# Patient Record
Sex: Male | Born: 1955 | Race: White | Hispanic: No | Marital: Married | Smoking: Former smoker
Health system: Southern US, Community
[De-identification: ages and names within clinical notes are randomized; demographics above are authoritative.]

## PROBLEM LIST (undated history)

## (undated) DIAGNOSIS — I10 Essential (primary) hypertension: Secondary | ICD-10-CM

## (undated) DIAGNOSIS — Z973 Presence of spectacles and contact lenses: Secondary | ICD-10-CM

## (undated) DIAGNOSIS — K219 Gastro-esophageal reflux disease without esophagitis: Secondary | ICD-10-CM

## (undated) DIAGNOSIS — M109 Gout, unspecified: Secondary | ICD-10-CM

## (undated) DIAGNOSIS — R350 Frequency of micturition: Secondary | ICD-10-CM

## (undated) DIAGNOSIS — G4730 Sleep apnea, unspecified: Secondary | ICD-10-CM

## (undated) DIAGNOSIS — E7800 Pure hypercholesterolemia, unspecified: Secondary | ICD-10-CM

## (undated) DIAGNOSIS — L309 Dermatitis, unspecified: Secondary | ICD-10-CM

## (undated) DIAGNOSIS — R0609 Other forms of dyspnea: Secondary | ICD-10-CM

## (undated) DIAGNOSIS — M1990 Unspecified osteoarthritis, unspecified site: Secondary | ICD-10-CM

## (undated) HISTORY — PX: OTHER SURGICAL HISTORY: SHX169

## (undated) HISTORY — DX: Other forms of dyspnea: R06.09

## (undated) HISTORY — PX: APPENDECTOMY: SHX54

## (undated) HISTORY — PX: TONSILLECTOMY: SUR1361

---

## 2004-08-04 ENCOUNTER — Ambulatory Visit (HOSPITAL_COMMUNITY): Admission: RE | Admit: 2004-08-04 | Discharge: 2004-08-04 | Payer: Self-pay | Admitting: *Deleted

## 2004-08-04 ENCOUNTER — Encounter (INDEPENDENT_AMBULATORY_CARE_PROVIDER_SITE_OTHER): Payer: Self-pay | Admitting: Specialist

## 2006-07-23 ENCOUNTER — Emergency Department: Payer: Self-pay | Admitting: Emergency Medicine

## 2006-12-09 ENCOUNTER — Ambulatory Visit: Payer: Self-pay | Admitting: Gastroenterology

## 2006-12-30 ENCOUNTER — Ambulatory Visit: Payer: Self-pay | Admitting: Gastroenterology

## 2007-01-24 ENCOUNTER — Ambulatory Visit: Payer: Self-pay | Admitting: Gastroenterology

## 2007-02-12 ENCOUNTER — Ambulatory Visit: Payer: Self-pay | Admitting: Gastroenterology

## 2007-11-14 DIAGNOSIS — I2510 Atherosclerotic heart disease of native coronary artery without angina pectoris: Secondary | ICD-10-CM | POA: Insufficient documentation

## 2007-11-14 DIAGNOSIS — K222 Esophageal obstruction: Secondary | ICD-10-CM | POA: Insufficient documentation

## 2007-11-14 DIAGNOSIS — K219 Gastro-esophageal reflux disease without esophagitis: Secondary | ICD-10-CM | POA: Insufficient documentation

## 2010-07-30 HISTORY — PX: OTHER SURGICAL HISTORY: SHX169

## 2010-12-12 NOTE — Assessment & Plan Note (Signed)
[facility]                         GASTROENTEROLOGY OFFICE NOTE   NAME:ALLENWallice, Mitchell Todd                      MRN:          [id number]  DATE:02/12/2007                            DOB:          21-Jun-1956    PROBLEM:  Dysphagia and nausea.   Mr. Lave has returned following upper endoscopy with Savary dilatation.  Since the dilatation he has had no further dysphagia.  Nausea has  subsided after switching from Aciphex to Protonix.  All together he is  feeling well and has no current GI complaints.   EXAMINATION:  Pulse 64, blood pressure 118/72, weight 193.   IMPRESSION:  1. Gastroesophageal reflux disease - well-controlled with proton pump      inhibitor therapy.  2. Esophageal stricture - asymptomatic following dilatation therapy.   RECOMMENDATIONS:  Continue Protonix and repeat dilatation as needed.     Barbette Hair. Arlyce Dice, MD,FACG  Electronically Signed    RDK/MedQ  DD: 02/12/2007  DT: 02/12/2007  Job #: [id number]   cc:   Molly Maduro A. Nicholos Johns, M.D.

## 2010-12-12 NOTE — Assessment & Plan Note (Signed)
[facility]                         GASTROENTEROLOGY OFFICE NOTE   NAME:Mitchell Todd, Mitchell Todd                      MRN:          [id number]  DATE:12/09/2006                            DOB:          24-Apr-1956    PROBLEM:  Nausea.   Mitchell Todd is a 55 year old white male, here for evaluation of nausea.  He tends to have nausea soon after breakfast and throughout the morning.  He occasionally vomits.  He denies abdominal pain, dysphagia or pyrosis.  He has a history of grade 3 esophagitis diagnosed in 2002.  He was  taking Nexium, until three years ago when he was switched to AcipHex.  The only other medication is Flomax.   PAST MEDICAL HISTORY:  Pertinent for both parents with heart disease.  Other medications include Metamucil and aspirin daily.   ALLERGIES:  HE IS ALLERGIC TO PENICILLIN.   SOCIAL HISTORY:  He does not smoke.  He drinks rarely.  He is married.   REVIEW OF SYSTEMS:  Was reviewed and is negative.   EXAMINATION:  VITAL SIGNS:  Pulse 80, blood pressure 122/82, weight 193.  HEENT:  EOMI. PERRLA. Sclerae are anicteric.  Conjunctivae are pink.  NECK:  Supple without thyromegaly, adenopathy or carotid bruits.  CHEST:  Clear to auscultation and percussion without adventitious  sounds.  CARDIAC:  Regular rhythm; normal S1 S2.  There are no murmurs, gallops  or rubs.  ABDOMEN:  Bowel sounds are normoactive.  Abdomen is soft, non-tender and  non-distended.  There are no abdominal masses, tenderness, splenic  enlargement or hepatomegaly.  EXTREMITIES:  Full range of motion.  No cyanosis, clubbing or edema.  RECTAL:  There are no masses.  Stool is Hemoccult negative.   IMPRESSION:  Nausea.  This could be due to medications (Aciphex),  effect.  Alternatively, he could indeed have esophagitis causing nausea.  Gastroparesis is another consideration.   RECOMMENDATION:  1. Hold Aciphex for several days, if symptoms are not improved or  worsened, then I will      proceed with upper endoscopy.  2. To consider screening colonoscopy at some point in the future.     Barbette Hair. Arlyce Dice, MD,FACG  Electronically Signed    RDK/MedQ  DD: 12/09/2006  DT: 12/09/2006  Job #: [id number]   cc:   Alanson Puls, M.D.  Rosanne Gutting

## 2010-12-12 NOTE — Assessment & Plan Note (Signed)
[facility]                         GASTROENTEROLOGY OFFICE NOTE   NAME:Mitchell Todd, Mitchell Todd                      MRN:          [id number]  DATE:12/30/2006                            DOB:          [birth year]    CHIEF COMPLAINT:  Nausea.   HISTORY:  Mitchell Todd has returned for a reevaluation.  Off AcipHex nausea  did slightly subside, but he then developed severe pyrosis.  With  resumption of AcipHex, reflux improved though still there.  He has some  dysphasia to solids.  Nausea is also persistent, but an intermittent  problem.   PHYSICAL EXAMINATION:  Pulse 80, blood pressure 122/76, weight 193.   IMPRESSION:  1. Nausea.  This is probably a combination of GERD and possibly his      PPI therapy.  2. Early dysphasia -- secondary to early esophageal stricture.   RECOMMENDATIONS:  Upper endoscopy with dilatation is indicated.  I will  consider switching him from AcipHex to another PPI; if nausea, in fact,  persists.     Barbette Hair. Arlyce Dice, MD,FACG  Electronically Signed    RDK/MedQ  DD: 12/30/2006  DT: 12/31/2006  Job #: [id number]   cc:   Molly Maduro A. Nicholos Johns, M.D.

## 2015-03-01 ENCOUNTER — Encounter: Payer: Self-pay | Admitting: Gastroenterology

## 2018-04-29 DIAGNOSIS — S060XAA Concussion with loss of consciousness status unknown, initial encounter: Secondary | ICD-10-CM

## 2018-04-29 DIAGNOSIS — S060X9A Concussion with loss of consciousness of unspecified duration, initial encounter: Secondary | ICD-10-CM

## 2018-04-29 HISTORY — DX: Concussion with loss of consciousness status unknown, initial encounter: S06.0XAA

## 2018-04-29 HISTORY — DX: Concussion with loss of consciousness of unspecified duration, initial encounter: S06.0X9A

## 2018-09-18 DIAGNOSIS — L821 Other seborrheic keratosis: Secondary | ICD-10-CM | POA: Diagnosis not present

## 2018-09-18 DIAGNOSIS — C44519 Basal cell carcinoma of skin of other part of trunk: Secondary | ICD-10-CM | POA: Diagnosis not present

## 2018-09-18 DIAGNOSIS — D485 Neoplasm of uncertain behavior of skin: Secondary | ICD-10-CM | POA: Diagnosis not present

## 2018-09-18 DIAGNOSIS — Z85820 Personal history of malignant melanoma of skin: Secondary | ICD-10-CM | POA: Diagnosis not present

## 2018-09-18 DIAGNOSIS — L308 Other specified dermatitis: Secondary | ICD-10-CM | POA: Diagnosis not present

## 2018-09-18 DIAGNOSIS — Z85828 Personal history of other malignant neoplasm of skin: Secondary | ICD-10-CM | POA: Diagnosis not present

## 2018-09-18 DIAGNOSIS — D225 Melanocytic nevi of trunk: Secondary | ICD-10-CM | POA: Diagnosis not present

## 2018-12-17 DIAGNOSIS — R7303 Prediabetes: Secondary | ICD-10-CM | POA: Diagnosis not present

## 2018-12-17 DIAGNOSIS — Z6832 Body mass index (BMI) 32.0-32.9, adult: Secondary | ICD-10-CM | POA: Diagnosis not present

## 2018-12-17 DIAGNOSIS — J309 Allergic rhinitis, unspecified: Secondary | ICD-10-CM | POA: Diagnosis not present

## 2018-12-17 DIAGNOSIS — E291 Testicular hypofunction: Secondary | ICD-10-CM | POA: Diagnosis not present

## 2018-12-17 DIAGNOSIS — Z0000 Encounter for general adult medical examination without abnormal findings: Secondary | ICD-10-CM | POA: Diagnosis not present

## 2018-12-17 DIAGNOSIS — K219 Gastro-esophageal reflux disease without esophagitis: Secondary | ICD-10-CM | POA: Diagnosis not present

## 2018-12-17 DIAGNOSIS — N400 Enlarged prostate without lower urinary tract symptoms: Secondary | ICD-10-CM | POA: Diagnosis not present

## 2018-12-17 DIAGNOSIS — G250 Essential tremor: Secondary | ICD-10-CM | POA: Diagnosis not present

## 2018-12-17 DIAGNOSIS — I10 Essential (primary) hypertension: Secondary | ICD-10-CM | POA: Diagnosis not present

## 2018-12-17 DIAGNOSIS — Z85820 Personal history of malignant melanoma of skin: Secondary | ICD-10-CM | POA: Diagnosis not present

## 2019-01-20 DIAGNOSIS — R7303 Prediabetes: Secondary | ICD-10-CM | POA: Diagnosis not present

## 2019-01-20 DIAGNOSIS — E291 Testicular hypofunction: Secondary | ICD-10-CM | POA: Diagnosis not present

## 2019-07-02 ENCOUNTER — Other Ambulatory Visit: Payer: Self-pay | Admitting: Family Medicine

## 2019-07-02 DIAGNOSIS — Z136 Encounter for screening for cardiovascular disorders: Secondary | ICD-10-CM

## 2019-07-14 ENCOUNTER — Ambulatory Visit
Admission: RE | Admit: 2019-07-14 | Discharge: 2019-07-14 | Disposition: A | Discharge status: Home / Self Care | Payer: PPO | Source: Ambulatory Visit | Attending: Family Medicine | Admitting: Family Medicine

## 2019-07-14 DIAGNOSIS — Z136 Encounter for screening for cardiovascular disorders: Secondary | ICD-10-CM | POA: Diagnosis not present

## 2019-07-14 DIAGNOSIS — Z87891 Personal history of nicotine dependence: Secondary | ICD-10-CM | POA: Diagnosis not present

## 2019-08-11 DIAGNOSIS — Z23 Encounter for immunization: Secondary | ICD-10-CM | POA: Diagnosis not present

## 2019-08-11 DIAGNOSIS — N400 Enlarged prostate without lower urinary tract symptoms: Secondary | ICD-10-CM | POA: Diagnosis not present

## 2019-08-11 DIAGNOSIS — K219 Gastro-esophageal reflux disease without esophagitis: Secondary | ICD-10-CM | POA: Diagnosis not present

## 2019-08-11 DIAGNOSIS — Z125 Encounter for screening for malignant neoplasm of prostate: Secondary | ICD-10-CM | POA: Diagnosis not present

## 2019-08-11 DIAGNOSIS — Z1211 Encounter for screening for malignant neoplasm of colon: Secondary | ICD-10-CM | POA: Diagnosis not present

## 2019-08-11 DIAGNOSIS — N3281 Overactive bladder: Secondary | ICD-10-CM | POA: Diagnosis not present

## 2019-08-11 DIAGNOSIS — I10 Essential (primary) hypertension: Secondary | ICD-10-CM | POA: Diagnosis not present

## 2019-08-11 DIAGNOSIS — J309 Allergic rhinitis, unspecified: Secondary | ICD-10-CM | POA: Diagnosis not present

## 2019-08-11 DIAGNOSIS — Z1389 Encounter for screening for other disorder: Secondary | ICD-10-CM | POA: Diagnosis not present

## 2019-08-11 DIAGNOSIS — Z136 Encounter for screening for cardiovascular disorders: Secondary | ICD-10-CM | POA: Diagnosis not present

## 2019-08-11 DIAGNOSIS — R7303 Prediabetes: Secondary | ICD-10-CM | POA: Diagnosis not present

## 2019-08-11 DIAGNOSIS — Z0000 Encounter for general adult medical examination without abnormal findings: Secondary | ICD-10-CM | POA: Diagnosis not present

## 2019-08-11 DIAGNOSIS — E291 Testicular hypofunction: Secondary | ICD-10-CM | POA: Diagnosis not present

## 2019-08-11 DIAGNOSIS — R7309 Other abnormal glucose: Secondary | ICD-10-CM | POA: Diagnosis not present

## 2019-08-11 DIAGNOSIS — G250 Essential tremor: Secondary | ICD-10-CM | POA: Diagnosis not present

## 2019-09-23 DIAGNOSIS — Z85828 Personal history of other malignant neoplasm of skin: Secondary | ICD-10-CM | POA: Diagnosis not present

## 2019-09-23 DIAGNOSIS — L821 Other seborrheic keratosis: Secondary | ICD-10-CM | POA: Diagnosis not present

## 2019-09-23 DIAGNOSIS — D225 Melanocytic nevi of trunk: Secondary | ICD-10-CM | POA: Diagnosis not present

## 2019-09-23 DIAGNOSIS — D1801 Hemangioma of skin and subcutaneous tissue: Secondary | ICD-10-CM | POA: Diagnosis not present

## 2019-09-23 DIAGNOSIS — Z85820 Personal history of malignant melanoma of skin: Secondary | ICD-10-CM | POA: Diagnosis not present

## 2019-09-23 DIAGNOSIS — L814 Other melanin hyperpigmentation: Secondary | ICD-10-CM | POA: Diagnosis not present

## 2019-09-28 DIAGNOSIS — K5730 Diverticulosis of large intestine without perforation or abscess without bleeding: Secondary | ICD-10-CM | POA: Diagnosis not present

## 2019-09-28 DIAGNOSIS — Z86010 Personal history of colonic polyps: Secondary | ICD-10-CM | POA: Diagnosis not present

## 2019-11-03 DIAGNOSIS — H2513 Age-related nuclear cataract, bilateral: Secondary | ICD-10-CM | POA: Diagnosis not present

## 2019-11-25 DIAGNOSIS — E7800 Pure hypercholesterolemia, unspecified: Secondary | ICD-10-CM | POA: Diagnosis not present

## 2019-11-25 DIAGNOSIS — I10 Essential (primary) hypertension: Secondary | ICD-10-CM | POA: Diagnosis not present

## 2019-11-25 DIAGNOSIS — N400 Enlarged prostate without lower urinary tract symptoms: Secondary | ICD-10-CM | POA: Diagnosis not present

## 2019-11-30 ENCOUNTER — Encounter: Payer: Self-pay | Admitting: Orthopedic Surgery

## 2019-11-30 ENCOUNTER — Ambulatory Visit: Payer: Self-pay

## 2019-11-30 ENCOUNTER — Ambulatory Visit (INDEPENDENT_AMBULATORY_CARE_PROVIDER_SITE_OTHER): Payer: PPO

## 2019-11-30 ENCOUNTER — Other Ambulatory Visit: Payer: Self-pay

## 2019-11-30 ENCOUNTER — Ambulatory Visit: Payer: PPO | Admitting: Physician Assistant

## 2019-11-30 DIAGNOSIS — G8929 Other chronic pain: Secondary | ICD-10-CM

## 2019-11-30 DIAGNOSIS — M25562 Pain in left knee: Secondary | ICD-10-CM | POA: Diagnosis not present

## 2019-11-30 DIAGNOSIS — M25561 Pain in right knee: Secondary | ICD-10-CM

## 2019-11-30 MED ORDER — METHYLPREDNISOLONE ACETATE 40 MG/ML IJ SUSP
40.0000 mg | INTRAMUSCULAR | Status: AC | PRN
  Administered 2019-11-30: 40 mg via INTRA_ARTICULAR

## 2019-11-30 MED ORDER — LIDOCAINE HCL 1 % IJ SOLN
1.0000 mL | INTRAMUSCULAR | Status: AC | PRN
  Administered 2019-11-30: 1 mL

## 2019-11-30 MED ORDER — METHYLPREDNISOLONE ACETATE 40 MG/ML IJ SUSP
40.0000 mg | INTRAMUSCULAR | Status: AC | PRN
  Administered 2019-11-30: 10:00:00 40 mg via INTRA_ARTICULAR

## 2019-11-30 NOTE — Progress Notes (Signed)
Office Visit Note   Patient: Mitchell Todd           Date of Birth: [birth year]           MRN: [id number] Visit Date: 11/30/2019              Requested by: Maury Dus, MD [place],  [place] [address] PCP: Maury Dus, MD  Chief Complaint  Patient presents with  . Right Knee - Pain  . Left Knee - Pain      HPI: This is a pleasant gentleman with bilateral anterior knee pain left greater than right is been going on for few months.  He does not have any mechanical symptoms no swelling and does not remember changing his activity level.  He notices most when he is going up and down stairs and when he has been sitting for a while and goes to a standing position  Assessment & Plan: Visit Diagnoses:  1. Chronic pain of both knees     Plan: Findings consistent with chondromalacia patella.  We talked about the natural history of this.  Quad quadricep strengthening exercises were discussed with him and demonstrated.  He is to do these daily.  I also talked about injecting his knees he would like to go forward with this today  Follow-Up Instructions: No follow-ups on file.   Ortho Exam  Patient is alert, oriented, no adenopathy, well-dressed, normal affect, normal respiratory effort. Bilateral knees no effusion no redness skin is in good condition.  He is nontender around the joint lines.  He does have some grinding more on the right than the left with flexion and extension.  Pain is reproduced around the patellofemoral joint  Imaging: No results found. No images are attached to the encounter.  Labs: No results found for: HGBA1C, ESRSEDRATE, CRP, LABURIC, REPTSTATUS, GRAMSTAIN, CULT, LABORGA   No results found for: ALBUMIN, PREALBUMIN, LABURIC  No results found for: MG No results found for: VD25OH  No results found for: PREALBUMIN No flowsheet data found.   There is no height or weight on file to calculate BMI.  Orders:  Orders Placed This  Encounter  Procedures  . XR Knee 1-2 Views Right  . XR Knee 1-2 Views Left   No orders of the defined types were placed in this encounter.    Procedures: Large Joint Inj: bilateral knee on 11/30/2019 9:50 AM Indications: pain and diagnostic evaluation Details: 22 G 1.5 in needle, anteromedial approach  Arthrogram: No  Medications (Right): 1 mL lidocaine 1 %; 40 mg methylPREDNISolone acetate 40 MG/ML Medications (Left): 1 mL lidocaine 1 %; 40 mg methylPREDNISolone acetate 40 MG/ML Outcome: tolerated well, no immediate complications Procedure, treatment alternatives, risks and benefits explained, specific risks discussed. Consent was given by the patient.      Clinical Data: No additional findings.  ROS:  All other systems negative, except as noted in the HPI. Review of Systems  Objective: Vital Signs: There were no vitals taken for this visit.  Specialty Comments:  No specialty comments available.  PMFS History: Patient Active Problem List   Diagnosis Date Noted  . CAD 11/14/2007  . ESOPHAGEAL STRICTURE 11/14/2007  . GERD 11/14/2007   No past medical history on file.  No family history on file.   Social History   Occupational History  . Not on file  Tobacco Use  . Smoking status: Not on file  Substance and Sexual Activity  . Alcohol use: Not on  file  . Drug use: Not on file  . Sexual activity: Not on file

## 2019-12-16 DIAGNOSIS — I10 Essential (primary) hypertension: Secondary | ICD-10-CM | POA: Diagnosis not present

## 2019-12-16 DIAGNOSIS — E7800 Pure hypercholesterolemia, unspecified: Secondary | ICD-10-CM | POA: Diagnosis not present

## 2019-12-16 DIAGNOSIS — N400 Enlarged prostate without lower urinary tract symptoms: Secondary | ICD-10-CM | POA: Diagnosis not present

## 2019-12-31 ENCOUNTER — Ambulatory Visit: Payer: PPO | Admitting: Orthopedic Surgery

## 2020-01-15 DIAGNOSIS — I10 Essential (primary) hypertension: Secondary | ICD-10-CM | POA: Diagnosis not present

## 2020-01-15 DIAGNOSIS — E7800 Pure hypercholesterolemia, unspecified: Secondary | ICD-10-CM | POA: Diagnosis not present

## 2020-01-15 DIAGNOSIS — N400 Enlarged prostate without lower urinary tract symptoms: Secondary | ICD-10-CM | POA: Diagnosis not present

## 2020-02-08 DIAGNOSIS — I10 Essential (primary) hypertension: Secondary | ICD-10-CM | POA: Diagnosis not present

## 2020-02-08 DIAGNOSIS — E7800 Pure hypercholesterolemia, unspecified: Secondary | ICD-10-CM | POA: Diagnosis not present

## 2020-02-08 DIAGNOSIS — J309 Allergic rhinitis, unspecified: Secondary | ICD-10-CM | POA: Diagnosis not present

## 2020-02-08 DIAGNOSIS — G250 Essential tremor: Secondary | ICD-10-CM | POA: Diagnosis not present

## 2020-02-08 DIAGNOSIS — N3281 Overactive bladder: Secondary | ICD-10-CM | POA: Diagnosis not present

## 2020-02-08 DIAGNOSIS — K219 Gastro-esophageal reflux disease without esophagitis: Secondary | ICD-10-CM | POA: Diagnosis not present

## 2020-02-08 DIAGNOSIS — N400 Enlarged prostate without lower urinary tract symptoms: Secondary | ICD-10-CM | POA: Diagnosis not present

## 2020-02-08 DIAGNOSIS — R7309 Other abnormal glucose: Secondary | ICD-10-CM | POA: Diagnosis not present

## 2020-02-08 DIAGNOSIS — E291 Testicular hypofunction: Secondary | ICD-10-CM | POA: Diagnosis not present

## 2020-02-09 DIAGNOSIS — N400 Enlarged prostate without lower urinary tract symptoms: Secondary | ICD-10-CM | POA: Diagnosis not present

## 2020-02-09 DIAGNOSIS — E7800 Pure hypercholesterolemia, unspecified: Secondary | ICD-10-CM | POA: Diagnosis not present

## 2020-02-09 DIAGNOSIS — I10 Essential (primary) hypertension: Secondary | ICD-10-CM | POA: Diagnosis not present

## 2020-02-19 DIAGNOSIS — E291 Testicular hypofunction: Secondary | ICD-10-CM | POA: Diagnosis not present

## 2020-04-07 DIAGNOSIS — I10 Essential (primary) hypertension: Secondary | ICD-10-CM | POA: Diagnosis not present

## 2020-04-07 DIAGNOSIS — E7800 Pure hypercholesterolemia, unspecified: Secondary | ICD-10-CM | POA: Diagnosis not present

## 2020-04-07 DIAGNOSIS — N400 Enlarged prostate without lower urinary tract symptoms: Secondary | ICD-10-CM | POA: Diagnosis not present

## 2020-04-07 DIAGNOSIS — K219 Gastro-esophageal reflux disease without esophagitis: Secondary | ICD-10-CM | POA: Diagnosis not present

## 2020-05-16 DIAGNOSIS — H43812 Vitreous degeneration, left eye: Secondary | ICD-10-CM | POA: Diagnosis not present

## 2020-05-17 ENCOUNTER — Other Ambulatory Visit: Payer: Self-pay | Admitting: Ophthalmology

## 2020-05-17 DIAGNOSIS — H3582 Retinal ischemia: Secondary | ICD-10-CM

## 2020-05-18 DIAGNOSIS — E7800 Pure hypercholesterolemia, unspecified: Secondary | ICD-10-CM | POA: Diagnosis not present

## 2020-05-18 DIAGNOSIS — I10 Essential (primary) hypertension: Secondary | ICD-10-CM | POA: Diagnosis not present

## 2020-05-18 DIAGNOSIS — N400 Enlarged prostate without lower urinary tract symptoms: Secondary | ICD-10-CM | POA: Diagnosis not present

## 2020-05-18 DIAGNOSIS — K219 Gastro-esophageal reflux disease without esophagitis: Secondary | ICD-10-CM | POA: Diagnosis not present

## 2020-05-24 ENCOUNTER — Other Ambulatory Visit: Payer: Self-pay

## 2020-05-24 ENCOUNTER — Ambulatory Visit
Admission: RE | Admit: 2020-05-24 | Discharge: 2020-05-24 | Disposition: A | Discharge status: Home / Self Care | Payer: PPO | Source: Ambulatory Visit | Attending: Ophthalmology | Admitting: Ophthalmology

## 2020-05-24 DIAGNOSIS — H3582 Retinal ischemia: Secondary | ICD-10-CM | POA: Diagnosis not present

## 2020-05-26 DIAGNOSIS — I6522 Occlusion and stenosis of left carotid artery: Secondary | ICD-10-CM | POA: Diagnosis not present

## 2020-05-30 DIAGNOSIS — E291 Testicular hypofunction: Secondary | ICD-10-CM | POA: Diagnosis not present

## 2020-06-01 DIAGNOSIS — Z20822 Contact with and (suspected) exposure to covid-19: Secondary | ICD-10-CM | POA: Diagnosis not present

## 2020-06-02 DIAGNOSIS — J0110 Acute frontal sinusitis, unspecified: Secondary | ICD-10-CM | POA: Diagnosis not present

## 2020-06-16 DIAGNOSIS — E7800 Pure hypercholesterolemia, unspecified: Secondary | ICD-10-CM | POA: Diagnosis not present

## 2020-06-16 DIAGNOSIS — K219 Gastro-esophageal reflux disease without esophagitis: Secondary | ICD-10-CM | POA: Diagnosis not present

## 2020-06-16 DIAGNOSIS — I10 Essential (primary) hypertension: Secondary | ICD-10-CM | POA: Diagnosis not present

## 2020-06-16 DIAGNOSIS — N400 Enlarged prostate without lower urinary tract symptoms: Secondary | ICD-10-CM | POA: Diagnosis not present

## 2020-07-25 DIAGNOSIS — E7800 Pure hypercholesterolemia, unspecified: Secondary | ICD-10-CM | POA: Diagnosis not present

## 2020-07-25 DIAGNOSIS — K219 Gastro-esophageal reflux disease without esophagitis: Secondary | ICD-10-CM | POA: Diagnosis not present

## 2020-07-25 DIAGNOSIS — I10 Essential (primary) hypertension: Secondary | ICD-10-CM | POA: Diagnosis not present

## 2020-07-25 DIAGNOSIS — N400 Enlarged prostate without lower urinary tract symptoms: Secondary | ICD-10-CM | POA: Diagnosis not present

## 2020-08-16 DIAGNOSIS — E291 Testicular hypofunction: Secondary | ICD-10-CM | POA: Diagnosis not present

## 2020-08-16 DIAGNOSIS — K219 Gastro-esophageal reflux disease without esophagitis: Secondary | ICD-10-CM | POA: Diagnosis not present

## 2020-08-16 DIAGNOSIS — Z0000 Encounter for general adult medical examination without abnormal findings: Secondary | ICD-10-CM | POA: Diagnosis not present

## 2020-08-16 DIAGNOSIS — M79671 Pain in right foot: Secondary | ICD-10-CM | POA: Diagnosis not present

## 2020-08-16 DIAGNOSIS — R0602 Shortness of breath: Secondary | ICD-10-CM | POA: Diagnosis not present

## 2020-08-16 DIAGNOSIS — Z125 Encounter for screening for malignant neoplasm of prostate: Secondary | ICD-10-CM | POA: Diagnosis not present

## 2020-08-16 DIAGNOSIS — E7800 Pure hypercholesterolemia, unspecified: Secondary | ICD-10-CM | POA: Diagnosis not present

## 2020-08-16 DIAGNOSIS — N3281 Overactive bladder: Secondary | ICD-10-CM | POA: Diagnosis not present

## 2020-08-16 DIAGNOSIS — R062 Wheezing: Secondary | ICD-10-CM | POA: Diagnosis not present

## 2020-08-16 DIAGNOSIS — G250 Essential tremor: Secondary | ICD-10-CM | POA: Diagnosis not present

## 2020-08-16 DIAGNOSIS — N400 Enlarged prostate without lower urinary tract symptoms: Secondary | ICD-10-CM | POA: Diagnosis not present

## 2020-08-16 DIAGNOSIS — R7309 Other abnormal glucose: Secondary | ICD-10-CM | POA: Diagnosis not present

## 2020-08-16 DIAGNOSIS — I10 Essential (primary) hypertension: Secondary | ICD-10-CM | POA: Diagnosis not present

## 2020-08-18 ENCOUNTER — Other Ambulatory Visit: Payer: Self-pay

## 2020-08-18 ENCOUNTER — Other Ambulatory Visit: Payer: Self-pay | Admitting: Family Medicine

## 2020-08-18 ENCOUNTER — Ambulatory Visit
Admission: RE | Admit: 2020-08-18 | Discharge: 2020-08-18 | Disposition: A | Discharge status: Home / Self Care | Payer: PPO | Source: Ambulatory Visit | Attending: Family Medicine | Admitting: Family Medicine

## 2020-08-18 DIAGNOSIS — M7989 Other specified soft tissue disorders: Secondary | ICD-10-CM | POA: Diagnosis not present

## 2020-08-18 DIAGNOSIS — M79671 Pain in right foot: Secondary | ICD-10-CM

## 2020-08-18 DIAGNOSIS — M25571 Pain in right ankle and joints of right foot: Secondary | ICD-10-CM | POA: Diagnosis not present

## 2020-09-13 DIAGNOSIS — R350 Frequency of micturition: Secondary | ICD-10-CM | POA: Diagnosis not present

## 2020-09-13 DIAGNOSIS — R3914 Feeling of incomplete bladder emptying: Secondary | ICD-10-CM | POA: Diagnosis not present

## 2020-09-13 DIAGNOSIS — R3912 Poor urinary stream: Secondary | ICD-10-CM | POA: Diagnosis not present

## 2020-09-13 DIAGNOSIS — N401 Enlarged prostate with lower urinary tract symptoms: Secondary | ICD-10-CM | POA: Diagnosis not present

## 2020-09-22 DIAGNOSIS — D225 Melanocytic nevi of trunk: Secondary | ICD-10-CM | POA: Diagnosis not present

## 2020-09-22 DIAGNOSIS — Z85828 Personal history of other malignant neoplasm of skin: Secondary | ICD-10-CM | POA: Diagnosis not present

## 2020-09-22 DIAGNOSIS — L814 Other melanin hyperpigmentation: Secondary | ICD-10-CM | POA: Diagnosis not present

## 2020-09-22 DIAGNOSIS — L821 Other seborrheic keratosis: Secondary | ICD-10-CM | POA: Diagnosis not present

## 2020-09-22 DIAGNOSIS — D485 Neoplasm of uncertain behavior of skin: Secondary | ICD-10-CM | POA: Diagnosis not present

## 2020-09-22 DIAGNOSIS — Z85820 Personal history of malignant melanoma of skin: Secondary | ICD-10-CM | POA: Diagnosis not present

## 2020-09-22 DIAGNOSIS — L820 Inflamed seborrheic keratosis: Secondary | ICD-10-CM | POA: Diagnosis not present

## 2020-09-28 DIAGNOSIS — N1831 Chronic kidney disease, stage 3a: Secondary | ICD-10-CM | POA: Diagnosis not present

## 2020-09-28 DIAGNOSIS — M109 Gout, unspecified: Secondary | ICD-10-CM | POA: Diagnosis not present

## 2020-09-29 DIAGNOSIS — M1030 Gout due to renal impairment, unspecified site: Secondary | ICD-10-CM | POA: Diagnosis not present

## 2020-09-29 DIAGNOSIS — N400 Enlarged prostate without lower urinary tract symptoms: Secondary | ICD-10-CM | POA: Diagnosis not present

## 2020-09-29 DIAGNOSIS — N1831 Chronic kidney disease, stage 3a: Secondary | ICD-10-CM | POA: Diagnosis not present

## 2020-09-29 DIAGNOSIS — K219 Gastro-esophageal reflux disease without esophagitis: Secondary | ICD-10-CM | POA: Diagnosis not present

## 2020-09-29 DIAGNOSIS — I10 Essential (primary) hypertension: Secondary | ICD-10-CM | POA: Diagnosis not present

## 2020-09-29 DIAGNOSIS — E7800 Pure hypercholesterolemia, unspecified: Secondary | ICD-10-CM | POA: Diagnosis not present

## 2020-10-06 DIAGNOSIS — N401 Enlarged prostate with lower urinary tract symptoms: Secondary | ICD-10-CM | POA: Diagnosis not present

## 2020-10-06 DIAGNOSIS — R350 Frequency of micturition: Secondary | ICD-10-CM | POA: Diagnosis not present

## 2020-10-06 DIAGNOSIS — R3914 Feeling of incomplete bladder emptying: Secondary | ICD-10-CM | POA: Diagnosis not present

## 2020-10-06 DIAGNOSIS — Z125 Encounter for screening for malignant neoplasm of prostate: Secondary | ICD-10-CM | POA: Diagnosis not present

## 2020-10-06 DIAGNOSIS — R3912 Poor urinary stream: Secondary | ICD-10-CM | POA: Diagnosis not present

## 2020-10-10 ENCOUNTER — Other Ambulatory Visit: Payer: Self-pay | Admitting: Urology

## 2020-11-09 DIAGNOSIS — N1831 Chronic kidney disease, stage 3a: Secondary | ICD-10-CM | POA: Diagnosis not present

## 2020-11-09 DIAGNOSIS — M1030 Gout due to renal impairment, unspecified site: Secondary | ICD-10-CM | POA: Diagnosis not present

## 2020-11-16 ENCOUNTER — Encounter (HOSPITAL_BASED_OUTPATIENT_CLINIC_OR_DEPARTMENT_OTHER): Payer: Self-pay | Admitting: Urology

## 2020-11-16 ENCOUNTER — Other Ambulatory Visit: Payer: Self-pay

## 2020-11-16 NOTE — Progress Notes (Signed)
Spoke w/ via phone for pre-op interview---pt Lab needs dos---- I stat, ekg              Lab results------us carotid bilateral 05-24-2020 epic COVID test -----11-18-2020 1015- Arrive at ------- 900 am 11-21-2020 NPO after MN NO Solid Food.  Clear liquids from MN until---800 am then npo Med rec completed Medications to take morning of surgery -----flomax, allopurinol, mybetriq, protonix, norvasc, zyrtec, proscar Diabetic medication -----n/a Patient instructed to bring photo id and insurance card day of surgery Patient aware to have Driver (ride ) / caregiver wife pam will stay for surgery (see note below for driver after surgery)   for 24 hours after surgery  Patient Special Instructions -----bring cpap mask tubing and machine day of surgery Pre-Op special Istructions -----pt given overnight stay instructions Patient verbalized understanding of instructions that were given at this phone interview. Patient denies shortness of breath, chest pain, fever, cough at this phone interview.  Pt plans to driver self home after surgery after spending the night, pt made aware if he is on any narcotic pain medications he will need driver day of surgery, pt vocalizaed understanding.

## 2020-11-17 ENCOUNTER — Other Ambulatory Visit (HOSPITAL_COMMUNITY)
Admission: RE | Admit: 2020-11-17 | Discharge: 2020-11-17 | Disposition: A | Discharge status: Home / Self Care | Payer: PPO | Source: Ambulatory Visit | Attending: Urology | Admitting: Urology

## 2020-11-17 DIAGNOSIS — Z20822 Contact with and (suspected) exposure to covid-19: Secondary | ICD-10-CM | POA: Diagnosis not present

## 2020-11-17 DIAGNOSIS — Z01812 Encounter for preprocedural laboratory examination: Secondary | ICD-10-CM | POA: Insufficient documentation

## 2020-11-17 LAB — SARS CORONAVIRUS 2 (TAT 6-24 HRS): SARS Coronavirus 2: NEGATIVE

## 2020-11-18 ENCOUNTER — Other Ambulatory Visit (HOSPITAL_COMMUNITY): Payer: PPO

## 2020-11-21 ENCOUNTER — Encounter (HOSPITAL_BASED_OUTPATIENT_CLINIC_OR_DEPARTMENT_OTHER): Payer: Self-pay | Admitting: Urology

## 2020-11-21 ENCOUNTER — Ambulatory Visit (HOSPITAL_BASED_OUTPATIENT_CLINIC_OR_DEPARTMENT_OTHER): Payer: PPO | Admitting: Anesthesiology

## 2020-11-21 ENCOUNTER — Encounter (HOSPITAL_BASED_OUTPATIENT_CLINIC_OR_DEPARTMENT_OTHER)
Admission: RE | Disposition: A | Discharge status: Home / Self Care | Payer: Self-pay | Source: Home / Self Care | Attending: Urology

## 2020-11-21 ENCOUNTER — Other Ambulatory Visit: Payer: Self-pay

## 2020-11-21 ENCOUNTER — Ambulatory Visit (HOSPITAL_BASED_OUTPATIENT_CLINIC_OR_DEPARTMENT_OTHER)
Admission: RE | Admit: 2020-11-21 | Discharge: 2020-11-22 | Disposition: A | Discharge status: Home / Self Care | Payer: PPO | Attending: Urology | Admitting: Urology

## 2020-11-21 DIAGNOSIS — R3912 Poor urinary stream: Secondary | ICD-10-CM | POA: Insufficient documentation

## 2020-11-21 DIAGNOSIS — Z880 Allergy status to penicillin: Secondary | ICD-10-CM | POA: Diagnosis not present

## 2020-11-21 DIAGNOSIS — I10 Essential (primary) hypertension: Secondary | ICD-10-CM | POA: Diagnosis not present

## 2020-11-21 DIAGNOSIS — E785 Hyperlipidemia, unspecified: Secondary | ICD-10-CM | POA: Diagnosis not present

## 2020-11-21 DIAGNOSIS — N189 Chronic kidney disease, unspecified: Secondary | ICD-10-CM | POA: Diagnosis not present

## 2020-11-21 DIAGNOSIS — Z7982 Long term (current) use of aspirin: Secondary | ICD-10-CM | POA: Insufficient documentation

## 2020-11-21 DIAGNOSIS — Z79899 Other long term (current) drug therapy: Secondary | ICD-10-CM | POA: Diagnosis not present

## 2020-11-21 DIAGNOSIS — R351 Nocturia: Secondary | ICD-10-CM | POA: Diagnosis not present

## 2020-11-21 DIAGNOSIS — K219 Gastro-esophageal reflux disease without esophagitis: Secondary | ICD-10-CM | POA: Insufficient documentation

## 2020-11-21 DIAGNOSIS — Z87891 Personal history of nicotine dependence: Secondary | ICD-10-CM | POA: Diagnosis not present

## 2020-11-21 DIAGNOSIS — N400 Enlarged prostate without lower urinary tract symptoms: Secondary | ICD-10-CM | POA: Diagnosis not present

## 2020-11-21 DIAGNOSIS — N3281 Overactive bladder: Secondary | ICD-10-CM | POA: Diagnosis not present

## 2020-11-21 DIAGNOSIS — R3914 Feeling of incomplete bladder emptying: Secondary | ICD-10-CM | POA: Diagnosis not present

## 2020-11-21 DIAGNOSIS — I129 Hypertensive chronic kidney disease with stage 1 through stage 4 chronic kidney disease, or unspecified chronic kidney disease: Secondary | ICD-10-CM | POA: Insufficient documentation

## 2020-11-21 DIAGNOSIS — R350 Frequency of micturition: Secondary | ICD-10-CM | POA: Insufficient documentation

## 2020-11-21 DIAGNOSIS — N401 Enlarged prostate with lower urinary tract symptoms: Secondary | ICD-10-CM | POA: Insufficient documentation

## 2020-11-21 DIAGNOSIS — Z85828 Personal history of other malignant neoplasm of skin: Secondary | ICD-10-CM | POA: Diagnosis not present

## 2020-11-21 DIAGNOSIS — N138 Other obstructive and reflux uropathy: Secondary | ICD-10-CM | POA: Insufficient documentation

## 2020-11-21 DIAGNOSIS — I2510 Atherosclerotic heart disease of native coronary artery without angina pectoris: Secondary | ICD-10-CM | POA: Diagnosis not present

## 2020-11-21 DIAGNOSIS — N320 Bladder-neck obstruction: Secondary | ICD-10-CM | POA: Diagnosis not present

## 2020-11-21 HISTORY — DX: Unspecified osteoarthritis, unspecified site: M19.90

## 2020-11-21 HISTORY — DX: Presence of spectacles and contact lenses: Z97.3

## 2020-11-21 HISTORY — DX: Dermatitis, unspecified: L30.9

## 2020-11-21 HISTORY — DX: Frequency of micturition: R35.0

## 2020-11-21 HISTORY — DX: Sleep apnea, unspecified: G47.30

## 2020-11-21 HISTORY — DX: Pure hypercholesterolemia, unspecified: E78.00

## 2020-11-21 HISTORY — PX: TRANSURETHRAL RESECTION OF PROSTATE: SHX73

## 2020-11-21 HISTORY — DX: Essential (primary) hypertension: I10

## 2020-11-21 HISTORY — DX: Gastro-esophageal reflux disease without esophagitis: K21.9

## 2020-11-21 HISTORY — DX: Gout, unspecified: M10.9

## 2020-11-21 LAB — POCT I-STAT, CHEM 8
BUN: 19 mg/dL (ref 8–23)
Calcium, Ion: 1.2 mmol/L (ref 1.15–1.40)
Chloride: 100 mmol/L (ref 98–111)
Creatinine, Ser: 1.1 mg/dL (ref 0.61–1.24)
Glucose, Bld: 109 mg/dL — ABNORMAL HIGH (ref 70–99)
HCT: 44 % (ref 39.0–52.0)
Hemoglobin: 15 g/dL (ref 13.0–17.0)
Potassium: 4.1 mmol/L (ref 3.5–5.1)
Sodium: 136 mmol/L (ref 135–145)
TCO2: 25 mmol/L (ref 22–32)

## 2020-11-21 SURGERY — TURP (TRANSURETHRAL RESECTION OF PROSTATE)
Anesthesia: General | Site: Prostate

## 2020-11-21 MED ORDER — PROPOFOL 10 MG/ML IV BOLUS
INTRAVENOUS | Status: AC
  Filled 2020-11-21: qty 20

## 2020-11-21 MED ORDER — HYDROCHLOROTHIAZIDE 12.5 MG PO CAPS
12.5000 mg | ORAL_CAPSULE | Freq: Every day | ORAL | Status: DC
  Administered 2020-11-21: 12.5 mg via ORAL
  Filled 2020-11-21: qty 1

## 2020-11-21 MED ORDER — IRBESARTAN 300 MG PO TABS
300.0000 mg | ORAL_TABLET | Freq: Every day | ORAL | Status: DC
  Administered 2020-11-21: 300 mg via ORAL
  Filled 2020-11-21: qty 1

## 2020-11-21 MED ORDER — TAMSULOSIN HCL 0.4 MG PO CAPS: 0.4000 mg | ORAL_CAPSULE | Freq: Every day | ORAL | Status: DC

## 2020-11-21 MED ORDER — DOCUSATE SODIUM 100 MG PO CAPS: 100.0000 mg | ORAL_CAPSULE | Freq: Every day | ORAL | 0 refills | Status: DC | PRN

## 2020-11-21 MED ORDER — DEXAMETHASONE SODIUM PHOSPHATE 4 MG/ML IJ SOLN
INTRAMUSCULAR | Status: DC | PRN
  Administered 2020-11-21: 8 mg via INTRAVENOUS

## 2020-11-21 MED ORDER — CEFAZOLIN SODIUM-DEXTROSE 2-3 GM-%(50ML) IV SOLR
INTRAVENOUS | Status: DC | PRN
  Administered 2020-11-21: 2 g via INTRAVENOUS

## 2020-11-21 MED ORDER — BELLADONNA ALKALOIDS-OPIUM 16.2-60 MG RE SUPP
RECTAL | Status: DC | PRN
  Administered 2020-11-21: 1 via RECTAL

## 2020-11-21 MED ORDER — CEFAZOLIN SODIUM-DEXTROSE 2-4 GM/100ML-% IV SOLN: 2.0000 g | Freq: Once | INTRAVENOUS | Status: DC

## 2020-11-21 MED ORDER — LACTATED RINGERS IV SOLN: INTRAVENOUS | Status: DC

## 2020-11-21 MED ORDER — OXYCODONE HCL 5 MG/5ML PO SOLN: 5.0000 mg | Freq: Once | ORAL | Status: DC | PRN

## 2020-11-21 MED ORDER — OXYCODONE-ACETAMINOPHEN 5-325 MG PO TABS
1.0000 | ORAL_TABLET | ORAL | Status: DC | PRN
Start: 2020-11-21 — End: 2020-11-22

## 2020-11-21 MED ORDER — CEFAZOLIN SODIUM-DEXTROSE 2-4 GM/100ML-% IV SOLN
INTRAVENOUS | Status: AC
  Filled 2020-11-21: qty 100

## 2020-11-21 MED ORDER — FAMOTIDINE 20 MG PO TABS
ORAL_TABLET | ORAL | Status: AC
  Filled 2020-11-21: qty 2

## 2020-11-21 MED ORDER — AMLODIPINE BESYLATE 5 MG PO TABS
5.0000 mg | ORAL_TABLET | Freq: Every day | ORAL | Status: DC
  Filled 2020-11-21: qty 1

## 2020-11-21 MED ORDER — LORATADINE 10 MG PO TABS: 10.0000 mg | ORAL_TABLET | Freq: Every day | ORAL | Status: DC

## 2020-11-21 MED ORDER — BELLADONNA ALKALOIDS-OPIUM 16.2-60 MG RE SUPP
RECTAL | Status: AC
  Filled 2020-11-21: qty 1

## 2020-11-21 MED ORDER — MIDAZOLAM HCL 2 MG/2ML IJ SOLN
INTRAMUSCULAR | Status: AC
  Filled 2020-11-21: qty 2

## 2020-11-21 MED ORDER — HYDROMORPHONE HCL 1 MG/ML IJ SOLN: 0.5000 mg | INTRAMUSCULAR | Status: DC | PRN

## 2020-11-21 MED ORDER — ACETAMINOPHEN 500 MG PO TABS
ORAL_TABLET | ORAL | Status: AC
  Filled 2020-11-21: qty 2

## 2020-11-21 MED ORDER — DOCUSATE SODIUM 100 MG PO CAPS
ORAL_CAPSULE | ORAL | Status: AC
  Filled 2020-11-21: qty 1

## 2020-11-21 MED ORDER — SODIUM CHLORIDE 0.9 % IR SOLN
3000.0000 mL | Status: DC
  Administered 2020-11-22: 3000 mL

## 2020-11-21 MED ORDER — ONDANSETRON HCL 4 MG/2ML IJ SOLN
4.0000 mg | Freq: Once | INTRAMUSCULAR | Status: AC | PRN
  Administered 2020-11-21: 4 mg via INTRAVENOUS

## 2020-11-21 MED ORDER — ONDANSETRON HCL 4 MG/2ML IJ SOLN
4.0000 mg | INTRAMUSCULAR | Status: DC | PRN
  Administered 2020-11-21: 4 mg via INTRAVENOUS

## 2020-11-21 MED ORDER — FENTANYL CITRATE (PF) 100 MCG/2ML IJ SOLN
INTRAMUSCULAR | Status: DC | PRN
  Administered 2020-11-21: 100 ug via INTRAVENOUS

## 2020-11-21 MED ORDER — FAMOTIDINE 20 MG PO TABS
40.0000 mg | ORAL_TABLET | Freq: Every day | ORAL | Status: DC
  Administered 2020-11-21: 40 mg via ORAL

## 2020-11-21 MED ORDER — DIPHENHYDRAMINE HCL 12.5 MG/5ML PO ELIX: 12.5000 mg | ORAL_SOLUTION | Freq: Four times a day (QID) | ORAL | Status: DC | PRN

## 2020-11-21 MED ORDER — LIDOCAINE HCL (CARDIAC) PF 100 MG/5ML IV SOSY
PREFILLED_SYRINGE | INTRAVENOUS | Status: DC | PRN
  Administered 2020-11-21: 60 mg via INTRAVENOUS

## 2020-11-21 MED ORDER — BACITRACIN-NEOMYCIN-POLYMYXIN 400-5-5000 EX OINT: 1.0000 "application " | TOPICAL_OINTMENT | Freq: Three times a day (TID) | CUTANEOUS | Status: DC | PRN

## 2020-11-21 MED ORDER — SODIUM CHLORIDE 0.9% FLUSH: 3.0000 mL | Freq: Two times a day (BID) | INTRAVENOUS | Status: DC

## 2020-11-21 MED ORDER — HYDROMORPHONE HCL 1 MG/ML IJ SOLN
INTRAMUSCULAR | Status: AC
  Filled 2020-11-21: qty 1

## 2020-11-21 MED ORDER — SODIUM CHLORIDE 0.9 % IR SOLN
Status: DC | PRN
  Administered 2020-11-21 (×3): 6000 mL
  Administered 2020-11-21: 3000 mL

## 2020-11-21 MED ORDER — OXYBUTYNIN CHLORIDE ER 10 MG PO TB24
ORAL_TABLET | ORAL | Status: AC
  Filled 2020-11-21: qty 1

## 2020-11-21 MED ORDER — PROPOFOL 10 MG/ML IV BOLUS
INTRAVENOUS | Status: DC | PRN
  Administered 2020-11-21: 200 mg via INTRAVENOUS
  Administered 2020-11-21: 50 mg via INTRAVENOUS

## 2020-11-21 MED ORDER — OXYCODONE HCL 5 MG PO TABS: 5.0000 mg | ORAL_TABLET | Freq: Once | ORAL | Status: DC | PRN

## 2020-11-21 MED ORDER — ONDANSETRON HCL 4 MG/2ML IJ SOLN
INTRAMUSCULAR | Status: AC
  Filled 2020-11-21: qty 2

## 2020-11-21 MED ORDER — ALLOPURINOL 300 MG PO TABS
300.0000 mg | ORAL_TABLET | Freq: Every day | ORAL | Status: DC
  Filled 2020-11-21: qty 1

## 2020-11-21 MED ORDER — DIPHENHYDRAMINE HCL 50 MG/ML IJ SOLN: 12.5000 mg | Freq: Four times a day (QID) | INTRAMUSCULAR | Status: DC | PRN

## 2020-11-21 MED ORDER — DOCUSATE SODIUM 100 MG PO CAPS
100.0000 mg | ORAL_CAPSULE | Freq: Two times a day (BID) | ORAL | Status: DC
  Administered 2020-11-21: 100 mg via ORAL

## 2020-11-21 MED ORDER — PHENYLEPHRINE HCL (PRESSORS) 10 MG/ML IV SOLN
INTRAVENOUS | Status: DC | PRN
  Administered 2020-11-21 (×3): 80 ug via INTRAVENOUS

## 2020-11-21 MED ORDER — ACETAMINOPHEN 325 MG PO TABS: 650.0000 mg | ORAL_TABLET | ORAL | Status: DC | PRN

## 2020-11-21 MED ORDER — MIDAZOLAM HCL 2 MG/2ML IJ SOLN
INTRAMUSCULAR | Status: DC | PRN
  Administered 2020-11-21: 1 mg via INTRAVENOUS

## 2020-11-21 MED ORDER — POTASSIUM CHLORIDE IN NACL 20-0.45 MEQ/L-% IV SOLN
INTRAVENOUS | Status: DC
  Filled 2020-11-21 (×4): qty 1000

## 2020-11-21 MED ORDER — HEPARIN SODIUM (PORCINE) 5000 UNIT/ML IJ SOLN
INTRAMUSCULAR | Status: AC
  Filled 2020-11-21: qty 1

## 2020-11-21 MED ORDER — BELLADONNA ALKALOIDS-OPIUM 16.2-60 MG RE SUPP: 1.0000 | Freq: Four times a day (QID) | RECTAL | Status: DC | PRN

## 2020-11-21 MED ORDER — EPHEDRINE SULFATE 50 MG/ML IJ SOLN
INTRAMUSCULAR | Status: DC | PRN
  Administered 2020-11-21: 5 mg via INTRAVENOUS

## 2020-11-21 MED ORDER — OXYCODONE-ACETAMINOPHEN 5-325 MG PO TABS: 1.0000 | ORAL_TABLET | ORAL | 0 refills | Status: DC | PRN

## 2020-11-21 MED ORDER — HEPARIN SODIUM (PORCINE) 5000 UNIT/ML IJ SOLN: 5000.0000 [IU] | Freq: Three times a day (TID) | INTRAMUSCULAR | Status: DC

## 2020-11-21 MED ORDER — SODIUM CHLORIDE 0.9% FLUSH: 3.0000 mL | INTRAVENOUS | Status: DC | PRN

## 2020-11-21 MED ORDER — ACETAMINOPHEN 500 MG PO TABS
1000.0000 mg | ORAL_TABLET | Freq: Once | ORAL | Status: AC
  Administered 2020-11-21: 1000 mg via ORAL

## 2020-11-21 MED ORDER — FENTANYL CITRATE (PF) 100 MCG/2ML IJ SOLN
INTRAMUSCULAR | Status: AC
  Filled 2020-11-21: qty 2

## 2020-11-21 MED ORDER — OXYBUTYNIN CHLORIDE ER 10 MG PO TB24
10.0000 mg | ORAL_TABLET | Freq: Every day | ORAL | Status: DC
  Administered 2020-11-21: 10 mg via ORAL

## 2020-11-21 MED ORDER — SODIUM CHLORIDE 0.9 % IV SOLN: 250.0000 mL | INTRAVENOUS | Status: DC | PRN

## 2020-11-21 MED ORDER — ZOLPIDEM TARTRATE 5 MG PO TABS: 5.0000 mg | ORAL_TABLET | Freq: Every evening | ORAL | Status: DC | PRN

## 2020-11-21 MED ORDER — HYDROMORPHONE HCL 1 MG/ML IJ SOLN
0.2500 mg | INTRAMUSCULAR | Status: DC | PRN
  Administered 2020-11-21: 0.5 mg via INTRAVENOUS

## 2020-11-21 MED ORDER — BACITRACIN-NEOMYCIN-POLYMYXIN OINTMENT TUBE
TOPICAL_OINTMENT | CUTANEOUS | Status: AC
  Filled 2020-11-21: qty 14.17

## 2020-11-21 SURGICAL SUPPLY — 27 items
BAG DRAIN URO-CYSTO SKYTR STRL (DRAIN) ×2 IMPLANT
BAG DRN RND TRDRP ANRFLXCHMBR (UROLOGICAL SUPPLIES) ×1
BAG DRN UROCATH (DRAIN) ×1
BAG URINE DRAIN 2000ML AR STRL (UROLOGICAL SUPPLIES) ×2 IMPLANT
BAG URINE LEG 500ML (DRAIN) IMPLANT
CATH FOLEY 2WAY SLVR 30CC 20FR (CATHETERS) IMPLANT
CATH FOLEY 3WAY 30CC 22FR (CATHETERS) ×2 IMPLANT
CLOTH BEACON ORANGE TIMEOUT ST (SAFETY) ×2 IMPLANT
ELECT BIVAP BIPO 22/24 DONUT (ELECTROSURGICAL) ×2
ELECT REM PT RETURN 9FT ADLT (ELECTROSURGICAL)
ELECTRD BIVAP BIPO 22/24 DONUT (ELECTROSURGICAL) ×1 IMPLANT
ELECTRODE REM PT RTRN 9FT ADLT (ELECTROSURGICAL) IMPLANT
GLOVE SURG ENC MOIS LTX SZ7 (GLOVE) ×2 IMPLANT
GOWN STRL REUS W/TWL LRG LVL3 (GOWN DISPOSABLE) ×2 IMPLANT
HOLDER FOLEY CATH W/STRAP (MISCELLANEOUS) ×2 IMPLANT
IV NS IRRIG 3000ML ARTHROMATIC (IV SOLUTION) ×20 IMPLANT
KIT TURNOVER CYSTO (KITS) ×2 IMPLANT
LOOP CUT BIPOLAR 24F LRG (ELECTROSURGICAL) ×2 IMPLANT
MANIFOLD NEPTUNE II (INSTRUMENTS) ×2 IMPLANT
NS IRRIG 500ML POUR BTL (IV SOLUTION) ×2 IMPLANT
PACK CYSTO (CUSTOM PROCEDURE TRAY) ×2 IMPLANT
SYR 30ML LL (SYRINGE) ×2 IMPLANT
SYR TOOMEY IRRIG 70ML (MISCELLANEOUS) ×2
SYRINGE TOOMEY IRRIG 70ML (MISCELLANEOUS) ×1 IMPLANT
TUBE CONNECTING 12X1/4 (SUCTIONS) ×2 IMPLANT
TUBING UROLOGY SET (TUBING) ×2 IMPLANT
WATER STERILE IRR 500ML POUR (IV SOLUTION) ×2 IMPLANT

## 2020-11-21 NOTE — Op Note (Signed)
Operative Note  Preoperative diagnosis:  1.  BPH with bladder outlet obstruction  Postoperative diagnosis: 1.  BPH with bladder outlet obstruction  Procedure(s): 1.  Bipolar transurethral resection of prostate  Surgeon: Rexene Alberts, MD  Assistants:  None  Anesthesia:  General  Complications:  None  EBL:  32ml  Specimens: 1. Prostate chips ID Type Source Tests Collected by Time Destination  1 : Prostate chips Tissue PATH GU resection / TURBT / partial nephrectomy SURGICAL PATHOLOGY Janith Lima, MD 11/21/2020 1135    Drains/Catheters: 1.  22Fr 3 way catheter with 73ml water into balloon  Intraoperative findings:   1. Trilobar obstructing prostate with large intravesical component. Excellent resection with wide open prostatic fossa and excellent hemostasis at the end of the case.  Indication:  Mitchell Todd is a 65 y.o. male with BPH with bladder outlet obstruction presenting for transurethral resection of the prostate. He had a TRUS volume of 62cm^3. Uroflow demonstrated peak flow of 7.9cc/sec and avg flow of 3.4cc/sec. Preop cystoscopy demonstrated trilobar obstructing prostate. He has failed alpha blocker and 5 alpha reductase inhibitors.  After thorough discussion including all relevant risk benefits and alternatives, he presents today for a bipolar TURP.  Description of procedure: The indication, alternatives, benefits and risks were discussed with the patient and informed consent was obtained.  Patient was brought to the operating room table, positioned supine, secured with a safety strap.  Pneumatic compression devices were placed on the lower extremities.  After the administration of intravenous antibiotics and general anesthesia, the patient was repositioned into the dorsal lithotomy position.  All pressure points were carefully padded.  A rectal examination was performed confirming a smooth symmetric enlarged gland.  The genitalia were prepped and draped in standard  sterile manner.  A timeout was completed, verifying the correct patient, surgical procedure and positioning prior to beginning the procedure.  Isotonic sodium chloride was used for irrigation.  A 26 French continuous-flow resectoscope sheath with the visual obturator and a 30 degree lens was advanced under direct vision into the bladder.  The anterior urethra appeared normal in its entirety.The prostatic urethra was elongated with trilobar hyperplasia.  On cystoscopic evaluation, his bladder capacity appeared normal, the bladder wall was noted to expand symmetrically in all dimensions.  There were no tumors, stones or foreign bodies present. The bladder was trabeculated with normal-appearing mucosa.  Both ureteral orifices were in their normal anatomic positions with clear urinary reflux noted bilaterally.  The obturator was removed and replaced by the working element with a resection loop.  The location of the ureteral orifices and the prostatic configuration were again confirmed.  Starting at the bladder neck and proceeding distally to the verumontanum a transurethral section of the prostate was performed using bipolar using energy of 4 and 5 for cutting and coagulation, respectively.  The procedure began at the bladder neck at the 5 o'clock and 7 o'clock positions and carefully carried distally to the verumontanum, resecting the intervening prostatic adenoma.  Next the left lateral lobe was resected to the level of the transverse capsular fibers.  The identical procedure was performed on the right lobe.  Attention was then directed anteriorly and the resection was completed from the 10 o'clock to 2 o'clock positions.  All bleeding vessels were fulgurated achieving meticulous hemostasis.  The bladder was irrigated with a Toomey syringe, ensuring removal of all prostate chips which were sent to pathology for evaluation.  Having completed the resection and the chips removed, we again  confirmed hemostasis  with the loop with coagulating current.  Upon completion of the entire procedure, the bladder and posterior urethra were reexamined, confirming open prostatic urethra and bladder neck without evidence of bleeding or perforation.  Both ureteral orifices and the external sphincter were noted to be intact.  The resectoscope was withdrawn under direct vision and a 22 French three-way Foley catheter with a 30 cc balloon was inserted into the bladder.  The balloon was inflated with 30 cc of sterile water and the catheter was placed on gentle traction.  After multiple manual irrigations ensuring clear return of the irrigant, the procedure was terminated.  The catheter was attached to a drainage bag and continuous bladder irrigation was started with normal saline.  The patient was positioned supine.  At the end of the procedure, all counts were correct.  Patient tolerated the procedure well and was taken to the recovery room satisfactory condition.  Plan: Continuous bladder irrigation overnight.  Plan to discharge home tomorrow with Foley catheter in place and void trial in the office in 3 days.  Matt R. [facility]  Pager: [phone]

## 2020-11-21 NOTE — Anesthesia Preprocedure Evaluation (Addendum)
Anesthesia Evaluation  Patient identified by MRN, date of birth, ID band Patient awake    Reviewed: Allergy & Precautions, NPO status , Patient's Chart, lab work & pertinent test results  Airway Mallampati: III  TM Distance: >3 FB Neck ROM: Full   Comment: Large neck Dental no notable dental hx. (+) Teeth Intact, Dental Advisory Given   Pulmonary sleep apnea and Continuous Positive Airway Pressure Ventilation , former smoker,    Pulmonary exam normal breath sounds clear to auscultation       Cardiovascular hypertension, Pt. on medications + CAD  Normal cardiovascular exam Rhythm:Regular Rate:Normal     Neuro/Psych negative neurological ROS  negative psych ROS   GI/Hepatic Neg liver ROS, GERD  Medicated and Controlled,  Endo/Other  negative endocrine ROS  Renal/GU negative Renal ROS   BPH    Musculoskeletal  (+) Arthritis , Osteoarthritis,    Abdominal (+) + obese,   Peds  Hematology negative hematology ROS (+)   Anesthesia Other Findings   Reproductive/Obstetrics negative OB ROS                            Anesthesia Physical Anesthesia Plan  ASA: II  Anesthesia Plan: General   Post-op Pain Management:    Induction: Intravenous  PONV Risk Score and Plan: 3 and Ondansetron, Dexamethasone, Midazolam and Treatment may vary due to age or medical condition  Airway Management Planned: LMA  Additional Equipment: None  Intra-op Plan:   Post-operative Plan: Extubation in OR  Informed Consent: I have reviewed the patients History and Physical, chart, labs and discussed the procedure including the risks, benefits and alternatives for the proposed anesthesia with the patient or authorized representative who has indicated his/her understanding and acceptance.     Dental advisory given  Plan Discussed with: CRNA  Anesthesia Plan Comments:        Anesthesia Quick Evaluation

## 2020-11-21 NOTE — Anesthesia Procedure Notes (Deleted)
Procedure Name: LMA Insertion Date/Time: 11/21/2020 10:31 AM Performed by: Georgeanne Nim, CRNA Pre-anesthesia Checklist: Patient identified, Emergency Drugs available, Suction available, Patient being monitored and Timeout performed Patient Re-evaluated:Patient Re-evaluated prior to induction Oxygen Delivery Method: Circle system utilized Preoxygenation: Pre-oxygenation with 100% oxygen Induction Type: IV induction LMA: LMA inserted LMA Size: 4.0 Number of attempts: 1 Placement Confirmation: positive ETCO2,  CO2 detector and breath sounds checked- equal and bilateral Tube secured with: Tape Dental Injury: Teeth and Oropharynx as per pre-operative assessment

## 2020-11-21 NOTE — Anesthesia Procedure Notes (Signed)
Procedure Name: LMA Insertion Date/Time: 11/21/2020 10:31 AM Performed by: Mishell Donalson P, CRNA Pre-anesthesia Checklist: Patient identified, Emergency Drugs available, Suction available, Patient being monitored and Timeout performed Patient Re-evaluated:Patient Re-evaluated prior to induction Oxygen Delivery Method: Circle system utilized Preoxygenation: Pre-oxygenation with 100% oxygen Induction Type: IV induction LMA: LMA inserted LMA Size: 4.0 Number of attempts: 1 Placement Confirmation: positive ETCO2,  CO2 detector and breath sounds checked- equal and bilateral Tube secured with: Tape Dental Injury: Teeth and Oropharynx as per pre-operative assessment        

## 2020-11-21 NOTE — Transfer of Care (Signed)
Immediate Anesthesia Transfer of Care Note  Patient: Mitchell Todd  Procedure(s) Performed: TRANSURETHRAL RESECTION OF THE PROSTATE (TURP)/ BIPOLAR (N/A Prostate)  Patient Location: PACU  Anesthesia Type:General  Level of Consciousness: awake, alert  and patient cooperative  Airway & Oxygen Therapy: Patient Spontanous Breathing and Patient connected to nasal cannula oxygen  Post-op Assessment: Report given to RN and Post -op Vital signs reviewed and stable  Post vital signs: Reviewed and stable  Last Vitals:  Vitals Value Taken Time  BP 115/80 11/21/20 1151  Temp    Pulse 81 11/21/20 1157  Resp 18 11/21/20 1157  SpO2 94 % 11/21/20 1157  Vitals shown include unvalidated device data.  Last Pain:  Vitals:   11/21/20 0930  TempSrc:   PainSc: 0-No pain      Patients Stated Pain Goal: 4 (05/25/24 3664)  Complications: No complications documented.

## 2020-11-21 NOTE — H&P (Signed)
--------------------------------------------------------------------------------   CC/HPI: Mitchell Todd is a 65 year old male seen today in follow-up with BPH with LUTS here for BPH evaluation.   He states for approximately 2 years, he has noted progressive worsening lower urinary tract symptoms. He has been on tamsulosin 0.8 mg for several years and has been on finasteride 5 mg for approximately 18 months. He notes sensation of incomplete bladder emptying. Recent PVR is 224 mL. He also has frequency, urgency, weak flow stream, straining to void and 3 time nocturia. His IPSS score is 25, QOL 6. He denies a history of recurrent urinary tract infections. He denies history of acute urinary retention. He does have some CKD with recent creatinine 1.3 on 08/16/2021.   He was recently started on Myrbetriq 50 mg by his PCP for overactive bladder symptoms. I do suspect that his catheter related to incomplete bladder emptying adequately relieve his obstruction, his symptoms would improve.   His PSA on 08/16/2020 was 0.6 and when corrected for the use of finasteride is 1.2 which is normal. He denies a family history of prostate cancer.   TRUS 10/06/2020 demonstrates 62cm^3 prostate.  Cystoscopy 10/06/2020 with trilobar hyperplasia and severely obstructing prostate.  Uroflow 10/06/20: Peak flow 7.9cc/sec, avg flow 3.4cc/sec  PVR 48ml.   He has a history of hypertension, hyperlipidemia, GERD, hypogonadism on topical testosterone, prediabetes. He denies cardiac or pulmonary history. He does take aspirin however no other anticoagulation.   Patient currently denies fever, chills, sweats, nausea, vomiting, abdominal or flank pain, gross hematuria or dysuria.     ALLERGIES: Penicillin    MEDICATIONS: Aspirin  Crestor  Finasteride  Hydrochlorothiazide  Myrbetriq  Tamsulosin Hcl  Zyrtec  Amlodipine Besilate  Glucosamine  Pepcid  Protonip  Telmisartan  Testosterone     GU PSH: None   NON-GU PSH: None    GU PMH: BPH w/LUTS - 09/13/2020 Encounter for Prostate Cancer screening - 09/13/2020 Incomplete bladder emptying - 09/13/2020 Urinary Frequency - 09/13/2020 Weak Urinary Stream - 09/13/2020    NON-GU PMH: GERD Gout Hypercholesterolemia Hypertension Skin Cancer, History Sleep Apnea    FAMILY HISTORY: 1 Daughter - Runs in Family   SOCIAL HISTORY: Marital Status: Married Ethnicity: Not Hispanic Or Latino; Race: White Current Smoking Status: Patient does not smoke anymore. Has not smoked since 08/31/1995. Smoked for 20 years. Smoked 3 packs per day.   Tobacco Use Assessment Completed: Used Tobacco in last 30 days? Does drink.  Drinks 4+ caffeinated drinks per day.    REVIEW OF SYSTEMS:    GU Review Male:   Patient denies frequent urination, hard to postpone urination, burning/ pain with urination, get up at night to urinate, leakage of urine, stream starts and stops, trouble starting your stream, have to strain to urinate , erection problems, and penile pain.  Gastrointestinal (Upper):   Patient denies nausea, vomiting, and indigestion/ heartburn.  Gastrointestinal (Lower):   Patient denies diarrhea and constipation.  Constitutional:   Patient denies fever, night sweats, weight loss, and fatigue.  Skin:   Patient denies itching and skin rash/ lesion.  Eyes:   Patient denies blurred vision and double vision.  Ears/ Nose/ Throat:   Patient denies sore throat and sinus problems.  Hematologic/Lymphatic:   Patient denies swollen glands and easy bruising.  Cardiovascular:   Patient denies leg swelling and chest pains.  Respiratory:   Patient denies cough and shortness of breath.  Endocrine:   Patient denies excessive thirst.  Musculoskeletal:   Patient denies back pain and joint pain.  Neurological:   Patient denies headaches and dizziness.  Psychologic:   Patient denies depression and anxiety.   VITAL SIGNS:      10/06/2020 08:36 AM  Weight 205 lb / 92.99 kg  Height 68.5 in / 173.99  cm  BP 136/94 mmHg  Pulse 75 /min  Temperature 97.8 F / 36.5 C  BMI 30.7 kg/m   GU PHYSICAL EXAMINATION:    Scrotum: No lesions. No edema. No cysts. No warts.  Epididymides: Right: no spermatocele, no masses, no cysts, no tenderness, no induration, no enlargement. Left: no spermatocele, no masses, no cysts, no tenderness, no induration, no enlargement.  Testes: No tenderness, no swelling, no enlargement left testes. No tenderness, no swelling, no enlargement right testes. Normal location left testes. Normal location right testes. No mass, no cyst, no varicocele, no hydrocele left testes. No mass, no cyst, no varicocele, no hydrocele right testes.  Urethral Meatus: Normal size. No lesion, no wart, no discharge, no polyp. Normal location.  Penis: Circumcised, no warts, no cracks. No dorsal Peyronie's plaques, no left corporal Peyronie's plaques, no right corporal Peyronie's plaques, no scarring, no warts. No balanitis, no meatal stenosis.   MULTI-SYSTEM PHYSICAL EXAMINATION:    Constitutional: Well-nourished. No physical deformities. Normally developed. Good grooming.  Respiratory: No labored breathing, no use of accessory muscles.   Cardiovascular: Normal temperature, normal extremity pulses, no swelling, no varicosities.  Gastrointestinal: No mass, no tenderness, no rigidity, non obese abdomen.     Complexity of Data:  Source Of History:  Patient, Medical Record Summary  Lab Test Review:   PSA  Records Review:   AUA Symptom Score, Previous Doctor Records, Previous Patient Records, IIEF Score  Urine Test Review:   Urinalysis  Urodynamics Review:   Review Bladder Scan, Review Flow Rate  X-Ray Review: Prostate Ultrasound: Reviewed Films. Reviewed Report. Discussed With Patient.     PROCEDURES:         Flexible Cystoscopy - 52000  Risks, benefits, and some of the potential complications of the procedure were discussed at length with the patient including infection, bleeding, voiding  discomfort, urinary retention, fever, chills, sepsis, and others. All questions were answered. Informed consent was obtained. Antibiotic prophylaxis was given. Sterile technique and intraurethral analgesia were used.  Meatus:  Normal size. Normal location. Normal condition.  Urethra:  No strictures.  External Sphincter:  Normal.  Verumontanum:  Normal.  Prostate:  Trilobar hyperplasia with severe obstruction and intravescial component protruding into the bladder.  Bladder Neck:  Non-obstructing.  Ureteral Orifices:  Normal location. Normal size. Normal shape. Effluxed clear urine.  Bladder:  No trabeculation. No tumors. Normal mucosa. No stones.      The lower urinary tract was carefully examined. The procedure was well-tolerated and without complications. Antibiotic instructions were given. Instructions were given to call the office immediately for bloody urine, difficulty urinating, urinary retention, painful or frequent urination, fever, chills, nausea, vomiting or other illness. The patient stated that he understood these instructions and would comply with them.          Prostate Ultrasound - 34196  Length: 5.17 cm Height: 4.60 cm Width: 5.02 cm Volume: 62.42 ml      The transrectal ultrasound probe is introduced into the rectum, and the prostate is visualized. Ultrasonography is utilized throughout the procedure. At the conclusion of the procedure, the ultrasound probe is removed. The patient tolerates the procedure without complication.  Patient confirmed No Neulasta OnPro Device.  Flow Rate - 51741  Time of Peak Flow: 0:03 min:sec  Flow Time: 0:29 min:sec  Peak Flow Rate: 7.9 cc/sec  Average Flow Rate: 3.4 cc/sec  Voided Volume: 101 cc  Total Void Time: 0:31 min:sec           PVR Ultrasound - 97673  Scanned Volume: 72 cc         Urinalysis Dipstick Dipstick Cont'd  Color: Yellow Bilirubin: Neg mg/dL  Appearance: Clear Ketones: Neg mg/dL  Specific Gravity:  1.020 Blood: Neg ery/uL  pH: 6.5 Protein: Neg mg/dL  Glucose: Neg mg/dL Urobilinogen: 0.2 mg/dL    Nitrites: Neg    Leukocyte Esterase: Neg leu/uL    ASSESSMENT:      ICD-10 Details  1 GU:   BPH w/LUTS - N40.1   2   Incomplete bladder emptying - R39.14   3   Urinary Frequency - R35.0   4   Weak Urinary Stream - R39.12   5   Encounter for Prostate Cancer screening - Z12.5    PLAN:           Orders Labs CULTURE, URINE          Document Letter(s):  Created for Patient: Clinical Summary         Notes:   #1. BPH with LUTS: IPSS is severe at 25. PVR 224 mL. TRUS 62cm^3. Cysto with trilobar obstructing prostate. He is already on maximal medical therapy with tamsulosin 0.8 mg, finasteride 5 mg and myrbetriq 50mg  all started by his PCP. DRE is 50 g, no nodules.  -Discussed surgical options for treatment including TURP, HoLEP, Urolift and Rezum. He elects to proceed with TURP. Risks and benefits discussed.   Risks and benefits of Transurethral Resection of the Prostate were reviewed in detail including infection, bleeding, blood transfusion, injury to bladder/urethra/surrounding structures, erectile dysfunction, urinary incontinence, bladder neck contracture, persistent obstructive and irritative voiding symptoms, and global anesthesia risks including but not limited to CVA, MI, DVT, PE, pneumonia, and death. He expressed understanding and desire to proceed.   #2 Prostate cancer screening: PSA on 08/16/2020 was 0.6 and when corrected for the use of finasteride is 1.2 which is normal. DRE 50 g, no nodules.   CC: Colbert Coyer, MD    Urology Preoperative H&P   Chief Complaint: BPH   History of Present Illness: ERIS BRECK is a 65 y.o. male with BPH here for TURP. Denies fevers, chills.    Past Medical History:  Diagnosis Date  . Arthritis    knees oa  . Concussion 04/2018   no residual from  . Eczema   . Elevated cholesterol   . GERD (gastroesophageal reflux disease)   . Gout    . Hypertension   . Sleep apnea    ues cpap set on 7  uses 2 oe 3 night s per week  . Urinary frequency   . Wears glasses     Past Surgical History:  Procedure Laterality Date  . APPENDECTOMY  as child  . foot cyst removed Right 2012  . TONSILLECTOMY  as child  . trigger finger surgeyr  left thumb 2012    Allergies:  Allergies  Allergen Reactions  . Penicillins     Childhood allergy    No family history on file.  Social History:  has no history on file for tobacco use, alcohol use, and drug use.  ROS: A complete review of systems was performed.  All systems are negative except for pertinent findings  as noted.  Physical Exam:  Vital signs in last 24 hours:   Constitutional:  Alert and oriented, No acute distress Cardiovascular: Regular rate and rhythm Respiratory: Normal respiratory effort, Lungs clear bilaterally GI: Abdomen is soft, nontender, nondistended, no abdominal masses GU: No CVA tenderness Lymphatic: No lymphadenopathy Neurologic: Grossly intact, no focal deficits Psychiatric: Normal mood and affect  Laboratory Data:  No results for input(s): WBC, HGB, HCT, PLT in the last 72 hours.  No results for input(s): NA, K, CL, GLUCOSE, BUN, CALCIUM, CREATININE in the last 72 hours.  Invalid input(s): CO3   No results found for this or any previous visit (from the past 24 hour(s)). Recent Results (from the past 240 hour(s))  SARS CORONAVIRUS 2 (TAT 6-24 HRS) Nasopharyngeal Nasopharyngeal Swab     Status: None   Collection Time: 11/17/20 10:47 AM   Specimen: Nasopharyngeal Swab  Result Value Ref Range Status   SARS Coronavirus 2 NEGATIVE NEGATIVE Final    Comment: (NOTE) SARS-CoV-2 target nucleic acids are NOT DETECTED.  The SARS-CoV-2 RNA is generally detectable in upper and lower respiratory specimens during the acute phase of infection. Negative results do not preclude SARS-CoV-2 infection, do not rule out co-infections with other pathogens, and  should not be used as the sole basis for treatment or other patient management decisions. Negative results must be combined with clinical observations, patient history, and epidemiological information. The expected result is Negative.  Fact Sheet for Patients: SugarRoll.be  Fact Sheet for Healthcare Providers: https://www.woods-mathews.com/  This test is not yet approved or cleared by the [place] FDA and  has been authorized for detection and/or diagnosis of SARS-CoV-2 by FDA under an Emergency Use Authorization (EUA). This EUA will remain  in effect (meaning this test can be used) for the duration of the COVID-19 declaration under Se ction 564(b)(1) of the Act, 21 U.S.C. section 360bbb-3(b)(1), unless the authorization is terminated or revoked sooner.  Performed at [facility], [place] [address], [place], [place] [address]     Renal Function: No results for input(s): CREATININE in the last 168 hours. CrCl cannot be calculated (No successful lab value found.).  Radiologic Imaging: No results found.  I independently reviewed the above imaging studies.  Assessment and Plan DIRK TESSMANN is a 65 y.o. male with BPH here for TURP. Ok to proceed.   Matt R. Jari Carollo MD 11/21/2020, 8:10 AM  [facility] Pager: [phone]

## 2020-11-22 ENCOUNTER — Encounter (HOSPITAL_BASED_OUTPATIENT_CLINIC_OR_DEPARTMENT_OTHER): Payer: Self-pay | Admitting: Urology

## 2020-11-22 DIAGNOSIS — N401 Enlarged prostate with lower urinary tract symptoms: Secondary | ICD-10-CM | POA: Diagnosis not present

## 2020-11-22 LAB — BASIC METABOLIC PANEL
Anion gap: 7 (ref 5–15)
BUN: 17 mg/dL (ref 8–23)
CO2: 28 mmol/L (ref 22–32)
Calcium: 8.6 mg/dL — ABNORMAL LOW (ref 8.9–10.3)
Chloride: 100 mmol/L (ref 98–111)
Creatinine, Ser: 1.39 mg/dL — ABNORMAL HIGH (ref 0.61–1.24)
GFR, Estimated: 56 mL/min — ABNORMAL LOW (ref >60)
Glucose, Bld: 123 mg/dL — ABNORMAL HIGH (ref 70–99)
Potassium: 4.1 mmol/L (ref 3.5–5.1)
Sodium: 135 mmol/L (ref 135–145)

## 2020-11-22 LAB — CBC
HCT: 40.3 % (ref 39.0–52.0)
Hemoglobin: 13.6 g/dL (ref 13.0–17.0)
MCH: 31.5 pg (ref 26.0–34.0)
MCHC: 33.7 g/dL (ref 30.0–36.0)
MCV: 93.3 fL (ref 80.0–100.0)
Platelets: 188 10*3/uL (ref 150–400)
RBC: 4.32 MIL/uL (ref 4.22–5.81)
RDW: 12.4 % (ref 11.5–15.5)
WBC: 9.2 10*3/uL (ref 4.0–10.5)
nRBC: 0 % (ref 0.0–0.2)

## 2020-11-22 LAB — SURGICAL PATHOLOGY

## 2020-11-22 NOTE — Progress Notes (Signed)
1 Day Post-Op Subjective: Denies pain. No nausea or emesis. Tolerating diet. Tolerating foley.  Objective: Vital signs in last 24 hours: Temp:  [97 F (36.1 C)-98 F (36.7 C)] 97.9 F (36.6 C) (04/26 0615) Pulse Rate:  [67-90] 70 (04/26 0615) Resp:  [12-18] 16 (04/26 0615) BP: (95-134)/(74-96) 108/75 (04/26 0615) SpO2:  [93 %-97 %] 93 % (04/26 0615) Weight:  [90.6 kg] 90.6 kg (04/25 0911)  Intake/Output from previous day: 04/25 0701 - 04/26 0700 In: 4250 [I.V.:1100] Out: 8255 [Urine:8250; Blood:5] Intake/Output this shift: No intake/output data recorded.  UOP: 8.2L clear with CBI  Physical Exam:  General: Alert and oriented CV: RRR Lungs: Clear Abdomen: Soft, ND, NT Ext: NT, No erythema  Lab Results: Recent Labs    11/21/20 0929 11/22/20 0244  HGB 15.0 13.6  HCT 44.0 40.3   BMET Recent Labs    11/21/20 0929 11/22/20 0244  NA 136 135  K 4.1 4.1  CL 100 100  CO2  --  28  GLUCOSE 109* 123*  BUN 19 17  CREATININE 1.10 1.39*  CALCIUM  --  8.6*     Studies/Results: No results found.  Assessment/Plan: 1. BPH s/p TURP 11/21/2020  -Pain control prn -Diet as tolerated -Foley to gravity. VT in office on Thursday. -Slight bump in Cr. UOs patent and well away from bladder neck at end of case. Suspect will recover. Plan for BMP on Thursday. -Discharge home today   LOS: 0 days   Matt R. Kash Davie MD 11/22/2020, 7:47 AM [facility]  Pager: [phone]

## 2020-11-22 NOTE — Discharge Instructions (Signed)
Transurethral Resection of the Prostate, Care After This sheet gives you information about how to care for yourself after your procedure. Your health care provider may also give you more specific instructions. If you have problems or questions, contact your health care provider. What can I expect after the procedure? After the procedure, it is common to have: Mild pain in your lower abdomen. Soreness or mild discomfort in your penis from having the catheter inserted during the procedure. A feeling of urgency when you need to urinate. A small amount of blood in your urine. You may notice some small blood clots in your urine. These are normal. Follow these instructions at home: Medicines Take over-the-counter and prescription medicines only as told by your health care provider. If you were prescribed an antibiotic medicine, take it as told by your health care provider. Do not stop taking the antibiotic even if you start to feel better. Ask your health care provider if the medicine prescribed to you: Requires you to avoid driving or using heavy machinery. Can cause constipation. You may need to take actions to prevent or treat constipation, such as: Take over-the-counter or prescription medicines. Eat foods that are high in fiber, such as fresh fruits and vegetables, whole grains, and beans. Limit foods that are high in fat and processed sugars, such as fried or sweet foods. Do not drive for 24 hours if you were given a sedative during your procedure. Activity Return to your normal activities as told by your health care provider. Ask your health care provider what activities are safe for you. Do not lift anything that is heavier than 10 lb (4.5 kg), or the limit that you are told, for 3 weeks after the procedure or until your health care provider says that it is safe. Avoid intense physical activity for as long as told by your health care provider. Avoid sitting for a long time without moving.  Get up and move around one or more times every few hours. This helps to prevent blood clots. You may increase your physical activity gradually as you start to feel better.   Lifestyle Do not drink alcohol for as long as told by your health care provider. This is especially important if you are taking prescription pain medicines. Do not engage in sexual activity until your health care provider says that you can do this. General instructions Do not take baths, swim, or use a hot tub until your health care provider approves. Drink enough fluid to keep your urine pale yellow. Urinate as soon as you feel the need to. Do not try to hold your urine for long periods of time. If your health care provider approves, you may take a stool softener for 2-3 weeks to prevent you from straining to have a bowel movement. Wear compression stockings as told by your health care provider. These stockings help to prevent blood clots and reduce swelling in your legs. Keep all follow-up visits as told by your health care provider. This is important.   Contact a health care provider if you have: Difficulty urinating. A fever. Pain that gets worse or does not improve with medicine. Blood in your urine that does not go away after 1 week of resting and drinking more fluids. Swelling in your penis or testicles. Get help right away if: You are unable to urinate. You are having more blood clots in your urine instead of fewer. You have: Large blood clots. A lot of blood in your urine. Pain in your  back or lower abdomen. Pain or swelling in your legs. Chills and you are shaking. Difficulty breathing or shortness of breath. Summary After the procedure, it is common to have a small amount of blood in your urine. Avoid heavy lifting and intense physical activity for as long as told by your health care provider. Urinate as soon as you feel the need to. Do not try to hold your urine for long periods of time. Keep all  follow-up visits as told by your health care provider. This is important. This information is not intended to replace advice given to you by your health care provider. Make sure you discuss any questions you have with your health care provider. Document Revised: 11/05/2018 Document Reviewed: 04/16/2018 Elsevier Patient Education  2021 [place], Adult An indwelling urinary catheter is a thin tube that is put into your bladder. The tube helps to drain pee (urine) out of your body. The tube goes in through your urethra. Your urethra is where pee comes out of your body. Your pee will come out through the catheter, then it will go into a bag (drainage bag). Take good care of your catheter so it will work well. How to wear your catheter and bag Supplies needed Sticky tape (adhesive tape) or a leg strap. Alcohol wipe or soap and water (if you use tape). A clean towel (if you use tape). Large overnight bag. Smaller bag (leg bag). Wearing your catheter Attach your catheter to your leg with tape or a leg strap. Make sure the catheter is not pulled tight. If a leg strap gets wet, take it off and put on a dry strap. If you use tape to hold the bag on your leg: Use an alcohol wipe or soap and water to wash your skin where the tape made it sticky before. Use a clean towel to pat-dry that skin. Use new tape to make the bag stay on your leg. Wearing your bags You should have been given a large overnight bag. You may wear the overnight bag in the day or night. Always have the overnight bag lower than your bladder.  Do not let the bag touch the floor. Before you go to sleep, put a clean plastic bag in a wastebasket. Then hang the overnight bag inside the wastebasket. You should also have a smaller leg bag that fits under your clothes. Always wear the leg bag below your knee. Do not wear your leg bag at night. How to care for your skin and catheter Supplies  needed A clean washcloth. Water and mild soap. A clean towel. Caring for your skin and catheter Clean the skin around your catheter every day: Wash your hands with soap and water. Wet a clean washcloth in warm water and mild soap. Clean the skin around your urethra. If you are male: Gently spread the folds of skin around your vagina (labia). With the washcloth in your other hand, wipe the inner side of your labia on each side. Wipe from front to back. If you are male: Pull back any skin that covers the end of your penis (foreskin). With the washcloth in your other hand, wipe your penis in small circles. Start wiping at the tip of your penis, then move away from the catheter. Move the foreskin back in place, if needed. With your free hand, hold the catheter close to where it goes into your body. Keep holding the catheter during cleaning so it does not get pulled out.  With the washcloth in your other hand, clean the catheter. Only wipe downward on the catheter. Do not wipe upward toward your body. Doing this may push germs into your urethra and cause infection. Use a clean towel to pat-dry the catheter and the skin around it. Make sure to wipe off all soap. Wash your hands with soap and water. Shower every day. Do not take baths. Do not use cream, ointment, or lotion on the area where the catheter goes into your body, unless your doctor tells you to. Do not use powders, sprays, or lotions on your genital area. Check your skin around the catheter every day for signs of infection. Check for: Redness, swelling, or pain. Fluid or blood. Warmth. Pus or a bad smell.      How to empty the bag Supplies needed Rubbing alcohol. Gauze pad or cotton ball. Tape or a leg strap. Emptying the bag Pour the pee out of your bag when it is ?- full, or at least 2-3 times a day. Do this for your overnight bag and your leg bag. Wash your hands with soap and water. Separate (detach) the bag from  your leg. Hold the bag over the toilet or a clean pail. Keep the bag lower than your hips and bladder. This is so the pee (urine) does not go back into the tube. Open the pour spout. It is at the bottom of the bag. Empty the pee into the toilet or pail. Do not let the pour spout touch any surface. Put rubbing alcohol on a gauze pad or cotton ball. Use the gauze pad or cotton ball to clean the pour spout. Close the pour spout. Attach the bag to your leg with tape or a leg strap. Wash your hands with soap and water. Follow instructions for cleaning the drainage bag: From the product maker. As told by your doctor. How to change the bag Supplies needed Alcohol wipes. A clean bag. Tape or a leg strap. Changing the bag Replace your bag when it starts to leak, smell bad, or look dirty. Wash your hands with soap and water. Separate the dirty bag from your leg. Pinch the catheter with your fingers so that pee does not spill out. Separate the catheter tube from the bag tube where these tubes connect (at the connection valve). Do not let the tubes touch any surface. Clean the end of the catheter tube with an alcohol wipe. Use a different alcohol wipe to clean the end of the bag tube. Connect the catheter tube to the tube of the clean bag. Attach the clean bag to your leg with tape or a leg strap. Do not make the bag tight on your leg. Wash your hands with soap and water. General rules Never pull on your catheter. Never try to take it out. Doing that can hurt you. Always wash your hands before and after you touch your catheter or bag. Use a mild, fragrance-free soap. If you do not have soap and water, use hand sanitizer. Always make sure there are no twists or bends (kinks) in the catheter tube. Always make sure there are no leaks in the catheter or bag. Drink enough fluid to keep your pee pale yellow. Do not take baths, swim, or use a hot tub. If you are male, wipe from front to back after  you poop (have a bowel movement).   Contact a doctor if: Your pee is cloudy. Your pee smells worse than usual. Your catheter gets clogged. Your  catheter leaks. Your bladder feels full. Get help right away if: You have redness, swelling, or pain where the catheter goes into your body. You have fluid, blood, pus, or a bad smell coming from the area where the catheter goes into your body. Your skin feels warm where the catheter goes into your body. You have a fever. You have pain in your: Belly (abdomen). Legs. Lower back. Bladder. You see blood in the catheter. Your pee is pink or red. You feel sick to your stomach (nauseous). You throw up (vomit). You have chills. Your pee is not draining into the bag. Your catheter gets pulled out. Summary An indwelling urinary catheter is a thin tube that is placed into the bladder to help drain pee (urine) out of the body. The catheter is placed into the part of the body that drains pee from the bladder (urethra). Taking good care of your catheter will keep it working properly and help prevent problems. Always wash your hands before and after touching your catheter or bag. Never pull on your catheter or try to take it out. This information is not intended to replace advice given to you by your health care provider. Make sure you discuss any questions you have with your health care provider. Document Revised: 11/07/2018 Document Reviewed: 03/01/2017 Elsevier Patient Education  2021 [place].  Activity:  You are encouraged to ambulate frequently (about every hour during waking hours) to help prevent blood clots from forming in your legs or lungs.     Diet: You should advance your diet as instructed by your physician.  It will be normal to have some bloating, nausea, and abdominal discomfort intermittently.   Prescriptions:  You will be provided a prescription for pain medication to take as needed.  If your pain is not severe enough to  require the prescription pain medication, you may take extra strength Tylenol instead which will have less side effects.  You should also take a prescribed stool softener to avoid straining with bowel movements as the prescription pain medication may constipate you.   What to call us about: You should call the office [phone]) if you develop fever > 101 or develop persistent vomiting. Activity:  You are encouraged to ambulate frequently (about every hour during waking hours) to help prevent blood clots from forming in your legs or lungs.    You have a foley catheter in place. Return to clinic in 3 days for foley removal.

## 2020-11-22 NOTE — Anesthesia Postprocedure Evaluation (Signed)
Anesthesia Post Note  Patient: Mitchell Todd  Procedure(s) Performed: TRANSURETHRAL RESECTION OF THE PROSTATE (TURP)/ BIPOLAR (N/A Prostate)     Patient location during evaluation: PACU Anesthesia Type: General Level of consciousness: awake and alert, oriented and patient cooperative Pain management: pain level controlled Vital Signs Assessment: post-procedure vital signs reviewed and stable Respiratory status: spontaneous breathing, nonlabored ventilation and respiratory function stable Cardiovascular status: blood pressure returned to baseline and stable Postop Assessment: no apparent nausea or vomiting Anesthetic complications: no   No complications documented.  Last Vitals:  Vitals:   11/22/20 0130 11/22/20 0615  BP: 110/75 108/75  Pulse: 67 70  Resp: 14 16  Temp: 36.4 C 36.6 C  SpO2: 93% 93%    Last Pain:  Vitals:   11/22/20 0615  TempSrc:   PainSc: 0-No pain                 Pervis Hocking

## 2020-11-22 NOTE — Discharge Summary (Signed)
Date of admission: 11/21/2020  Date of discharge: 11/22/2020  Admission diagnosis: BPH  Discharge diagnosis: BPH  Secondary diagnoses: none  History and Physical: For full details, please see admission history and physical. Briefly, Mitchell Todd is a 65 y.o. year old patient with BPH who underwent TURP.   Hospital Course: The patient recovered in the usual expected fashion.  He had his diet advanced slowly.  Initially managed with IV pain control, then transitioned to PO meds when he was tolerating oral intake.  His labs were stable throughout the hospital course.  He was discharged to home on POD#1.  At the time of discharge the patient was tolerating a regular diet, passing flatus, ambulating, had adequate pain control and was agreeable to discharge.  Follow up as scheduled.    Laboratory values:  Recent Labs    11/21/20 0929 11/22/20 0244  HGB 15.0 13.6  HCT 44.0 40.3   Recent Labs    11/21/20 0929 11/22/20 0244  CREATININE 1.10 1.39*    Disposition: Home  Discharge instruction: The patient was instructed to be ambulatory but told to refrain from heavy lifting, strenuous activity, or driving.   Discharge medications:  Allergies as of 11/22/2020      Reactions   Penicillins    Childhood allergy      Medication List    TAKE these medications   allopurinol 300 MG tablet Commonly known as: ZYLOPRIM Take 300 mg by mouth daily.   amLODipine 5 MG tablet Commonly known as: NORVASC Take 5 mg by mouth daily.   aspirin EC 81 MG tablet Take 81 mg by mouth daily. Swallow whole.   cetirizine 10 MG tablet Commonly known as: ZYRTEC Take 10 mg by mouth daily.   CRESTOR PO Take by mouth. 2 x week   docusate sodium 100 MG capsule Commonly known as: Colace Take 1 capsule (100 mg total) by mouth daily as needed for up to 30 doses.   famotidine 40 MG tablet Commonly known as: PEPCID Take 40 mg by mouth at bedtime.   finasteride 5 MG tablet Commonly known as:  PROSCAR Take 5 mg by mouth daily.   GLUCOSAMINE COMPLEX PO Take by mouth. With msm 1 tab daily   hydrochlorothiazide 12.5 MG capsule Commonly known as: MICROZIDE Take 12.5 mg by mouth daily.   hydrocortisone cream 1 % Apply 1 application topically as needed for itching.   mirabegron ER 50 MG Tb24 tablet Commonly known as: MYRBETRIQ Take 50 mg by mouth daily.   oxyCODONE-acetaminophen 5-325 MG tablet Commonly known as: Percocet Take 1 tablet by mouth every 4 (four) hours as needed for up to 18 doses for severe pain.   PROTONIX PO Take 40 mg by mouth daily.   tamsulosin 0.4 MG Caps capsule Commonly known as: FLOMAX Take 0.4 mg by mouth daily.   telmisartan 80 MG tablet Commonly known as: MICARDIS Take 80 mg by mouth daily.       Followup:   Follow-up Information    [facility] On 11/24/2020.   Why: 8:30AM Contact information: [address]. [facility]  Pager: [phone]

## 2020-11-24 DIAGNOSIS — R3912 Poor urinary stream: Secondary | ICD-10-CM | POA: Diagnosis not present

## 2020-12-20 DIAGNOSIS — R350 Frequency of micturition: Secondary | ICD-10-CM | POA: Diagnosis not present

## 2020-12-20 DIAGNOSIS — R3914 Feeling of incomplete bladder emptying: Secondary | ICD-10-CM | POA: Diagnosis not present

## 2020-12-20 DIAGNOSIS — R3912 Poor urinary stream: Secondary | ICD-10-CM | POA: Diagnosis not present

## 2021-02-03 DIAGNOSIS — N400 Enlarged prostate without lower urinary tract symptoms: Secondary | ICD-10-CM | POA: Diagnosis not present

## 2021-02-03 DIAGNOSIS — M1030 Gout due to renal impairment, unspecified site: Secondary | ICD-10-CM | POA: Diagnosis not present

## 2021-02-03 DIAGNOSIS — I10 Essential (primary) hypertension: Secondary | ICD-10-CM | POA: Diagnosis not present

## 2021-02-03 DIAGNOSIS — E7800 Pure hypercholesterolemia, unspecified: Secondary | ICD-10-CM | POA: Diagnosis not present

## 2021-02-03 DIAGNOSIS — K219 Gastro-esophageal reflux disease without esophagitis: Secondary | ICD-10-CM | POA: Diagnosis not present

## 2021-02-03 DIAGNOSIS — N1831 Chronic kidney disease, stage 3a: Secondary | ICD-10-CM | POA: Diagnosis not present

## 2021-02-13 DIAGNOSIS — J309 Allergic rhinitis, unspecified: Secondary | ICD-10-CM | POA: Diagnosis not present

## 2021-02-13 DIAGNOSIS — M109 Gout, unspecified: Secondary | ICD-10-CM | POA: Diagnosis not present

## 2021-02-13 DIAGNOSIS — R7303 Prediabetes: Secondary | ICD-10-CM | POA: Diagnosis not present

## 2021-02-13 DIAGNOSIS — R7309 Other abnormal glucose: Secondary | ICD-10-CM | POA: Diagnosis not present

## 2021-02-13 DIAGNOSIS — R062 Wheezing: Secondary | ICD-10-CM | POA: Diagnosis not present

## 2021-02-13 DIAGNOSIS — N400 Enlarged prostate without lower urinary tract symptoms: Secondary | ICD-10-CM | POA: Diagnosis not present

## 2021-02-13 DIAGNOSIS — E291 Testicular hypofunction: Secondary | ICD-10-CM | POA: Diagnosis not present

## 2021-02-13 DIAGNOSIS — E7800 Pure hypercholesterolemia, unspecified: Secondary | ICD-10-CM | POA: Diagnosis not present

## 2021-02-13 DIAGNOSIS — N1831 Chronic kidney disease, stage 3a: Secondary | ICD-10-CM | POA: Diagnosis not present

## 2021-02-13 DIAGNOSIS — I10 Essential (primary) hypertension: Secondary | ICD-10-CM | POA: Diagnosis not present

## 2021-02-13 DIAGNOSIS — R0602 Shortness of breath: Secondary | ICD-10-CM | POA: Diagnosis not present

## 2021-02-13 DIAGNOSIS — K219 Gastro-esophageal reflux disease without esophagitis: Secondary | ICD-10-CM | POA: Diagnosis not present

## 2021-02-13 DIAGNOSIS — G250 Essential tremor: Secondary | ICD-10-CM | POA: Diagnosis not present

## 2021-03-08 DIAGNOSIS — Z85820 Personal history of malignant melanoma of skin: Secondary | ICD-10-CM | POA: Diagnosis not present

## 2021-03-08 DIAGNOSIS — Z85828 Personal history of other malignant neoplasm of skin: Secondary | ICD-10-CM | POA: Diagnosis not present

## 2021-03-08 DIAGNOSIS — L2089 Other atopic dermatitis: Secondary | ICD-10-CM | POA: Diagnosis not present

## 2021-03-22 DIAGNOSIS — N400 Enlarged prostate without lower urinary tract symptoms: Secondary | ICD-10-CM | POA: Diagnosis not present

## 2021-03-22 DIAGNOSIS — I10 Essential (primary) hypertension: Secondary | ICD-10-CM | POA: Diagnosis not present

## 2021-03-22 DIAGNOSIS — N1831 Chronic kidney disease, stage 3a: Secondary | ICD-10-CM | POA: Diagnosis not present

## 2021-03-22 DIAGNOSIS — K219 Gastro-esophageal reflux disease without esophagitis: Secondary | ICD-10-CM | POA: Diagnosis not present

## 2021-03-22 DIAGNOSIS — E7800 Pure hypercholesterolemia, unspecified: Secondary | ICD-10-CM | POA: Diagnosis not present

## 2021-03-22 DIAGNOSIS — M1030 Gout due to renal impairment, unspecified site: Secondary | ICD-10-CM | POA: Diagnosis not present

## 2021-03-28 DIAGNOSIS — N99115 Postprocedural fossa navicularis urethral stricture: Secondary | ICD-10-CM | POA: Diagnosis not present

## 2021-03-28 DIAGNOSIS — N401 Enlarged prostate with lower urinary tract symptoms: Secondary | ICD-10-CM | POA: Diagnosis not present

## 2021-05-04 IMAGING — US US ABDOMINAL AORTA SCREENING AAA
1 series · 14 of 17 positions shown · non-contrast
Comparison: None.

CLINICAL DATA: Male between 65-75 years of age with a smoking
history.

EXAM:
US ABDOMINAL AORTA MEDICARE SCREENING
TECHNIQUE: Ultrasound examination of the abdominal aorta was performed as a
screening evaluation for abdominal aortic aneurysm.

[Series 1: us abdominal aorta screening aaa · 0.31mm/px · 14 of 17 slices shown]
[im 1/17]
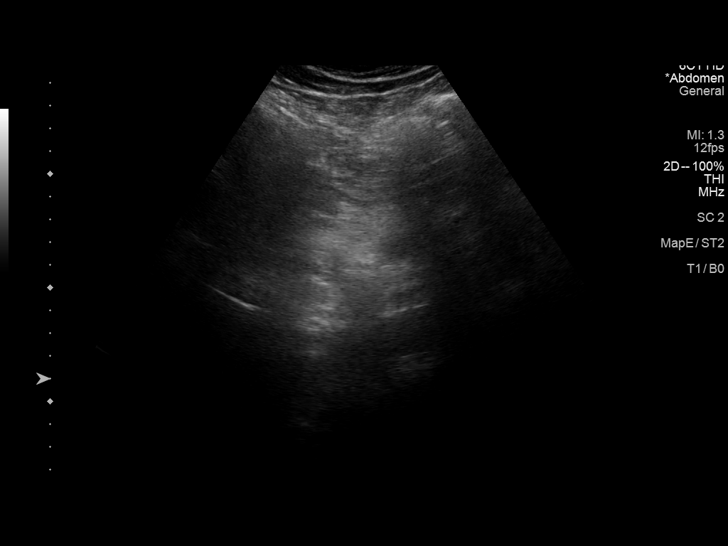
[im 2/17]
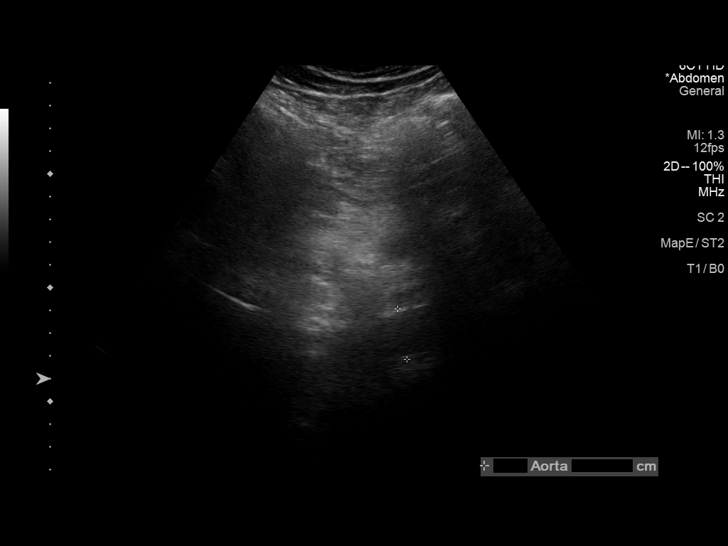
[im 4/17]
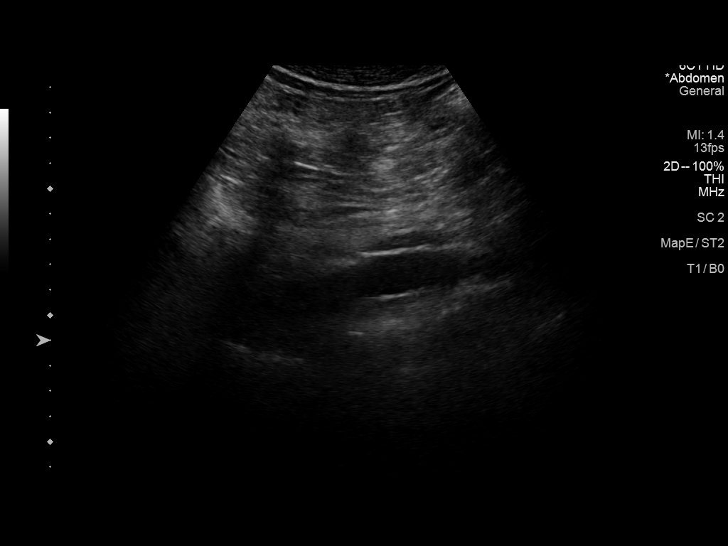
[im 5/17]
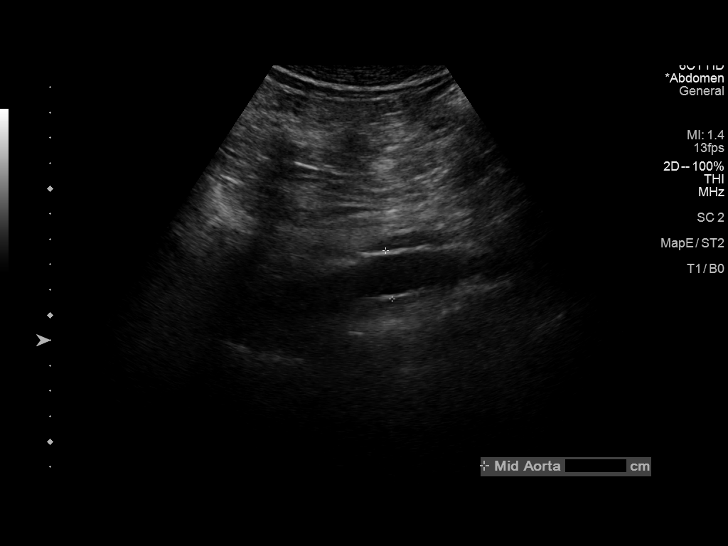
[im 6/17]
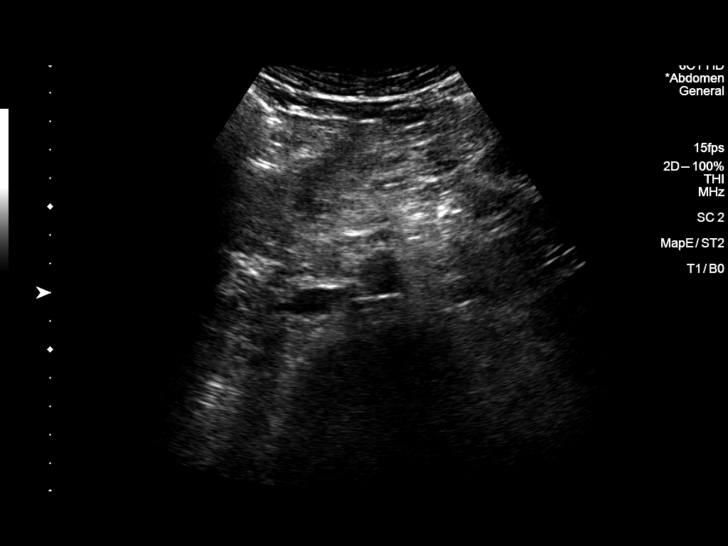
[im 7/17]
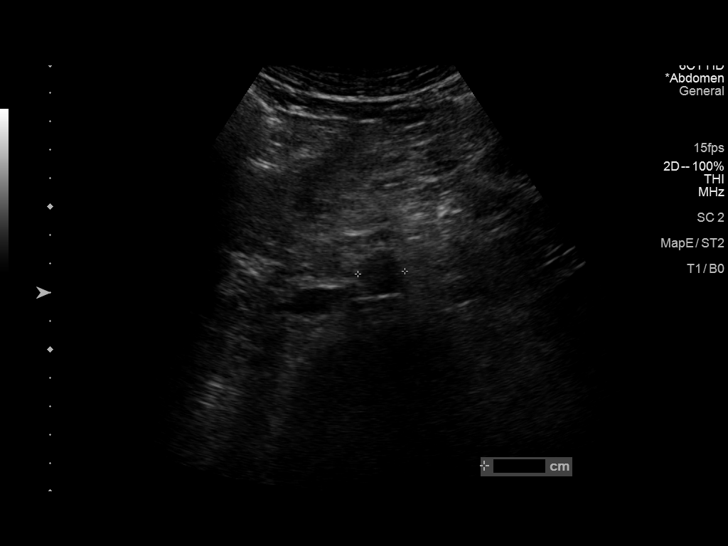
[im 8/17]
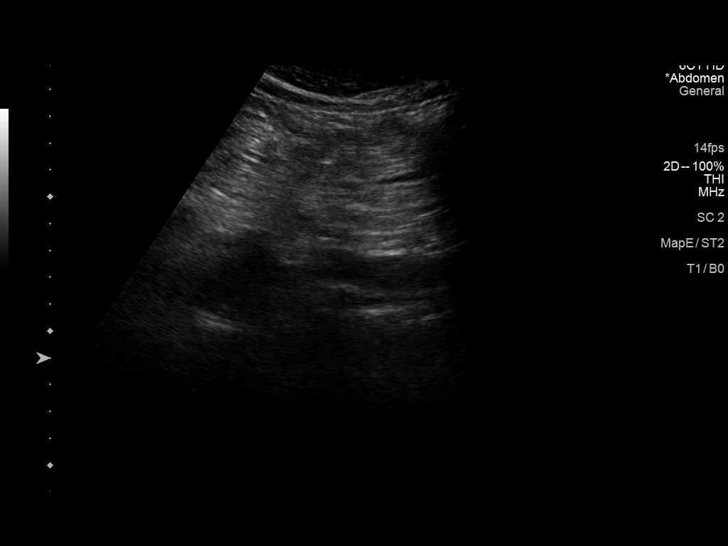
[im 10/17]
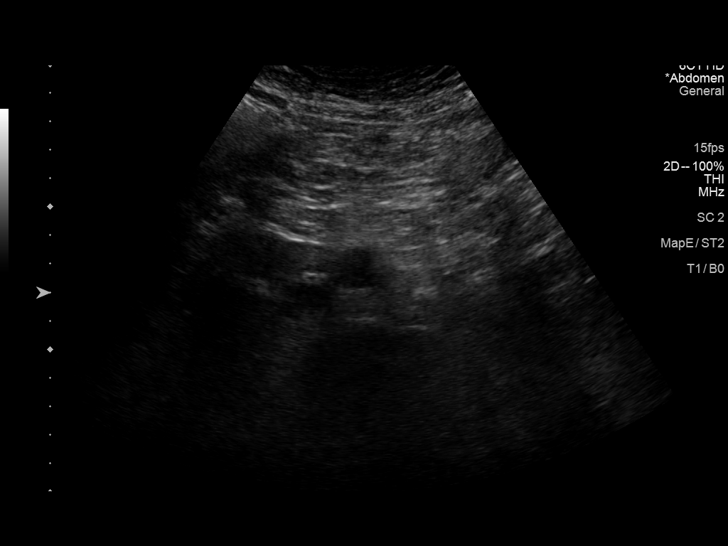
[im 11/17]
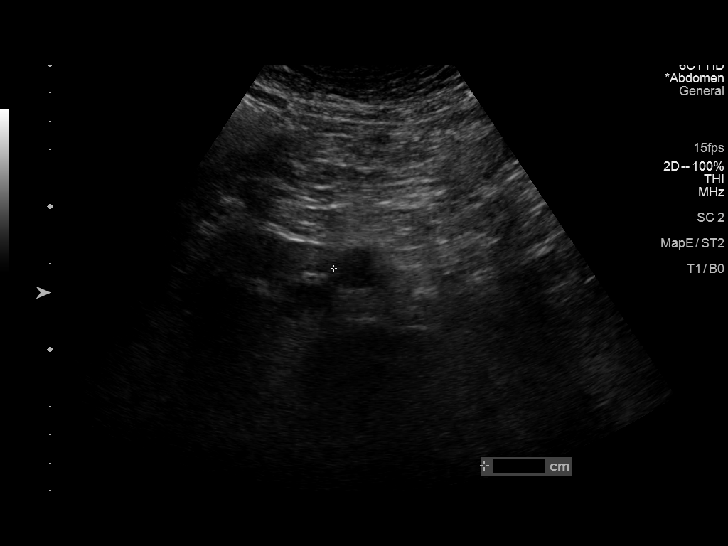
[im 12/17]
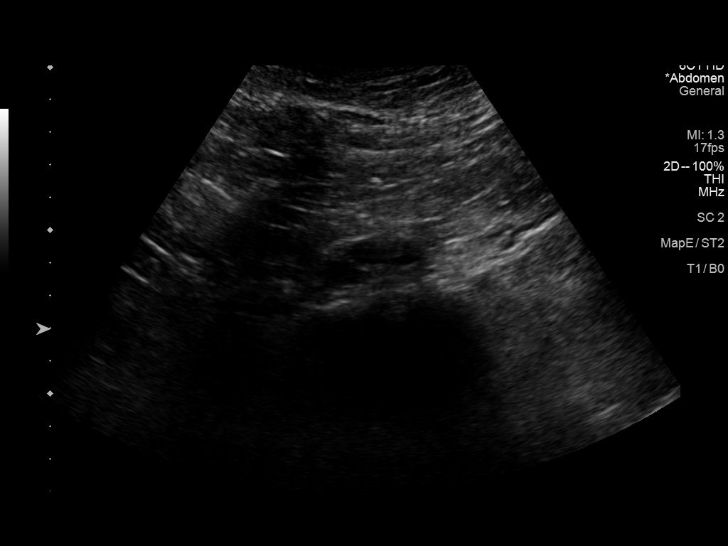
[im 13/17]
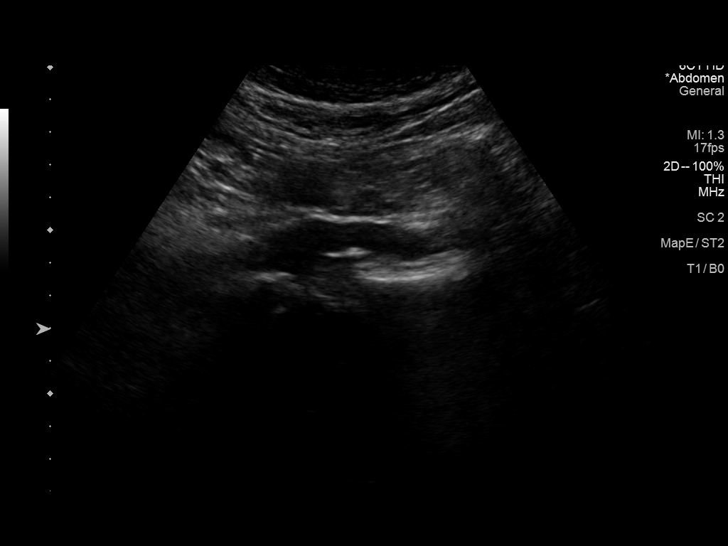
[im 14/17]
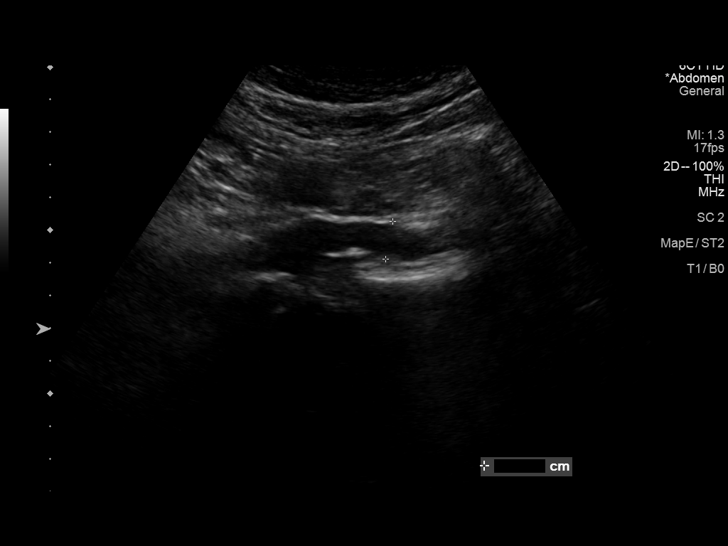
[im 16/17]
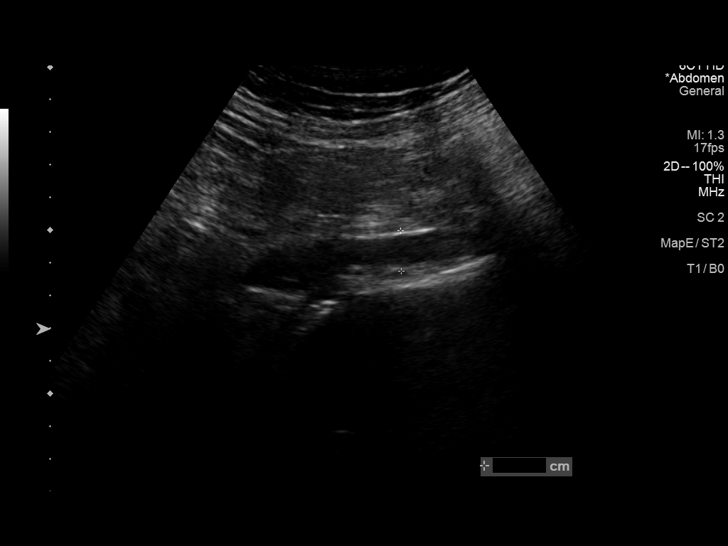
[im 17/17]
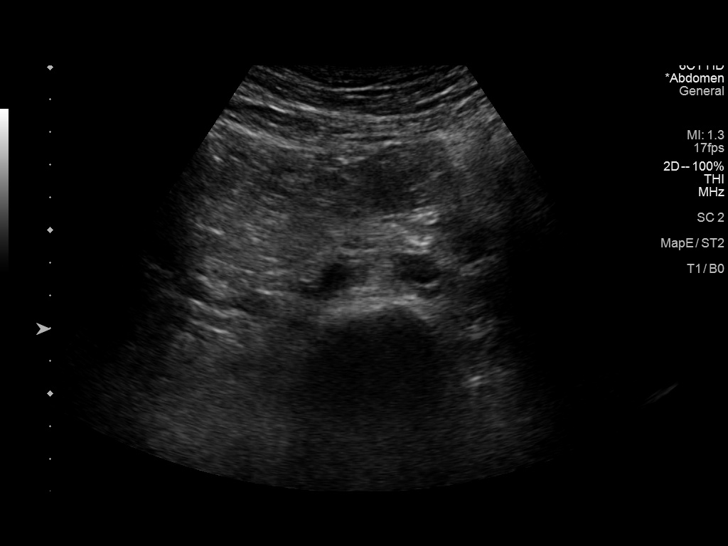

[14 of 17 positions shown; findings below may reference images not displayed]

FINDINGS: Abdominal aortic measurements as follows:

Proximal:  2.3 cm

Mid:  1.9 cm

Distal:  1.7 cm

Minor atherosclerotic change without significant aneurysm. Maximal
diameter 2.3 cm.
IMPRESSION: Aortic atherosclerosis without aneurysm.  Maximal diameter 2.3 cm.

## 2021-05-07 DIAGNOSIS — M1030 Gout due to renal impairment, unspecified site: Secondary | ICD-10-CM | POA: Diagnosis not present

## 2021-05-07 DIAGNOSIS — N1831 Chronic kidney disease, stage 3a: Secondary | ICD-10-CM | POA: Diagnosis not present

## 2021-05-07 DIAGNOSIS — K219 Gastro-esophageal reflux disease without esophagitis: Secondary | ICD-10-CM | POA: Diagnosis not present

## 2021-05-07 DIAGNOSIS — N400 Enlarged prostate without lower urinary tract symptoms: Secondary | ICD-10-CM | POA: Diagnosis not present

## 2021-05-07 DIAGNOSIS — E7800 Pure hypercholesterolemia, unspecified: Secondary | ICD-10-CM | POA: Diagnosis not present

## 2021-05-07 DIAGNOSIS — I10 Essential (primary) hypertension: Secondary | ICD-10-CM | POA: Diagnosis not present

## 2021-06-16 DIAGNOSIS — I10 Essential (primary) hypertension: Secondary | ICD-10-CM | POA: Diagnosis not present

## 2021-06-16 DIAGNOSIS — N400 Enlarged prostate without lower urinary tract symptoms: Secondary | ICD-10-CM | POA: Diagnosis not present

## 2021-06-16 DIAGNOSIS — K219 Gastro-esophageal reflux disease without esophagitis: Secondary | ICD-10-CM | POA: Diagnosis not present

## 2021-06-16 DIAGNOSIS — E7800 Pure hypercholesterolemia, unspecified: Secondary | ICD-10-CM | POA: Diagnosis not present

## 2021-06-16 DIAGNOSIS — M1030 Gout due to renal impairment, unspecified site: Secondary | ICD-10-CM | POA: Diagnosis not present

## 2021-06-16 DIAGNOSIS — N1831 Chronic kidney disease, stage 3a: Secondary | ICD-10-CM | POA: Diagnosis not present

## 2021-06-28 DIAGNOSIS — N401 Enlarged prostate with lower urinary tract symptoms: Secondary | ICD-10-CM | POA: Diagnosis not present

## 2021-06-28 DIAGNOSIS — R350 Frequency of micturition: Secondary | ICD-10-CM | POA: Diagnosis not present

## 2021-06-28 DIAGNOSIS — N99115 Postprocedural fossa navicularis urethral stricture: Secondary | ICD-10-CM | POA: Diagnosis not present

## 2021-08-29 DIAGNOSIS — G250 Essential tremor: Secondary | ICD-10-CM | POA: Diagnosis not present

## 2021-08-29 DIAGNOSIS — R0602 Shortness of breath: Secondary | ICD-10-CM | POA: Diagnosis not present

## 2021-08-29 DIAGNOSIS — Z0000 Encounter for general adult medical examination without abnormal findings: Secondary | ICD-10-CM | POA: Diagnosis not present

## 2021-08-29 DIAGNOSIS — N400 Enlarged prostate without lower urinary tract symptoms: Secondary | ICD-10-CM | POA: Diagnosis not present

## 2021-08-29 DIAGNOSIS — M109 Gout, unspecified: Secondary | ICD-10-CM | POA: Diagnosis not present

## 2021-08-29 DIAGNOSIS — Z23 Encounter for immunization: Secondary | ICD-10-CM | POA: Diagnosis not present

## 2021-08-29 DIAGNOSIS — R7303 Prediabetes: Secondary | ICD-10-CM | POA: Diagnosis not present

## 2021-08-29 DIAGNOSIS — N1831 Chronic kidney disease, stage 3a: Secondary | ICD-10-CM | POA: Diagnosis not present

## 2021-08-29 DIAGNOSIS — E7800 Pure hypercholesterolemia, unspecified: Secondary | ICD-10-CM | POA: Diagnosis not present

## 2021-08-29 DIAGNOSIS — Z1389 Encounter for screening for other disorder: Secondary | ICD-10-CM | POA: Diagnosis not present

## 2021-08-29 DIAGNOSIS — I10 Essential (primary) hypertension: Secondary | ICD-10-CM | POA: Diagnosis not present

## 2021-08-29 DIAGNOSIS — Z125 Encounter for screening for malignant neoplasm of prostate: Secondary | ICD-10-CM | POA: Diagnosis not present

## 2021-09-12 DIAGNOSIS — E291 Testicular hypofunction: Secondary | ICD-10-CM | POA: Diagnosis not present

## 2022-01-15 DIAGNOSIS — N400 Enlarged prostate without lower urinary tract symptoms: Secondary | ICD-10-CM | POA: Diagnosis not present

## 2022-01-15 DIAGNOSIS — K219 Gastro-esophageal reflux disease without esophagitis: Secondary | ICD-10-CM | POA: Diagnosis not present

## 2022-01-15 DIAGNOSIS — I10 Essential (primary) hypertension: Secondary | ICD-10-CM | POA: Diagnosis not present

## 2022-01-15 DIAGNOSIS — E7800 Pure hypercholesterolemia, unspecified: Secondary | ICD-10-CM | POA: Diagnosis not present

## 2022-01-15 DIAGNOSIS — N1831 Chronic kidney disease, stage 3a: Secondary | ICD-10-CM | POA: Diagnosis not present

## 2022-01-15 DIAGNOSIS — M1030 Gout due to renal impairment, unspecified site: Secondary | ICD-10-CM | POA: Diagnosis not present

## 2022-03-15 IMAGING — US US CAROTID DUPLEX BILAT
1 series · 13 of 24 positions shown · non-contrast
Comparison: None.

CLINICAL DATA: 64-year-old male with history of ocular ischemic
syndrome on the right for 3 weeks.

EXAM:
BILATERAL CAROTID DUPLEX ULTRASOUND
TECHNIQUE: Gray scale imaging, color Doppler and duplex ultrasound were
performed of bilateral carotid and vertebral arteries in the neck.

[Series 1: us carotid duplex bilat · 0.07mm/px · 13 of 65 slices shown]
[im 1/65]
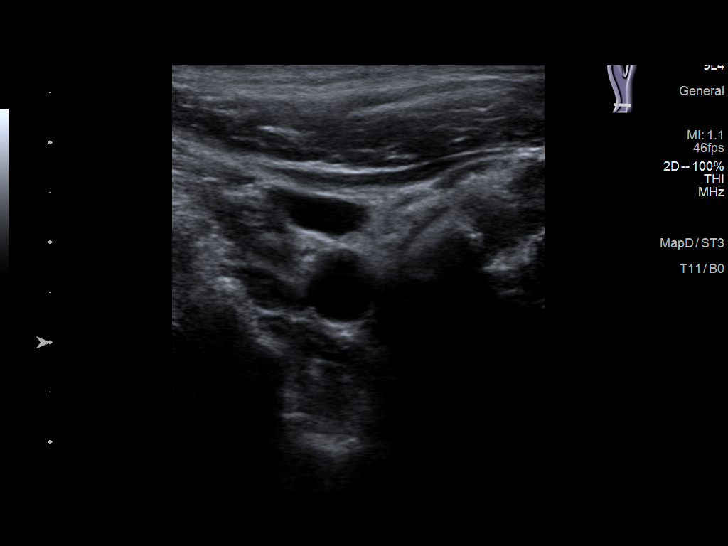
[im 6/65]
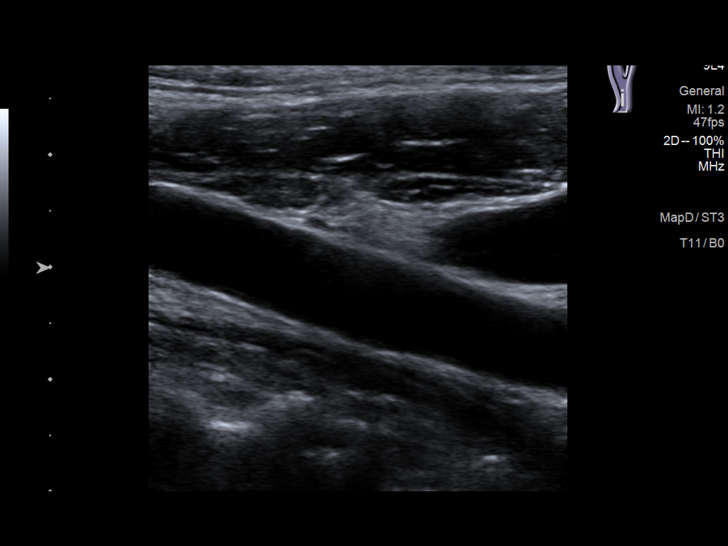
[im 12/65]
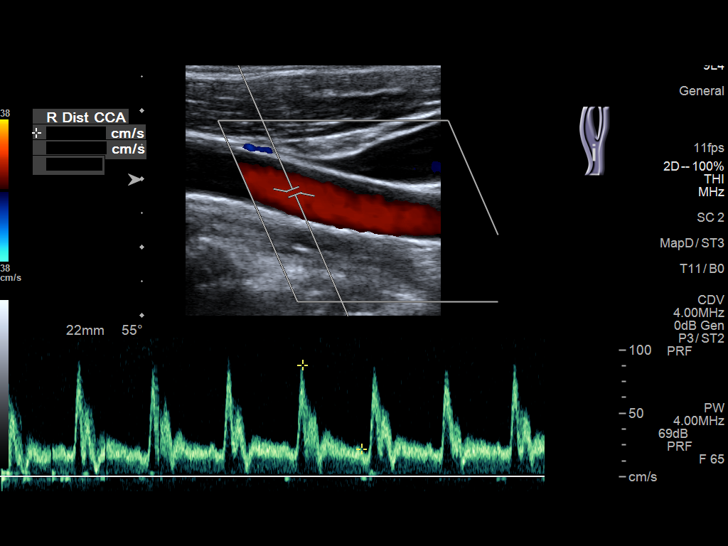
[im 17/65]
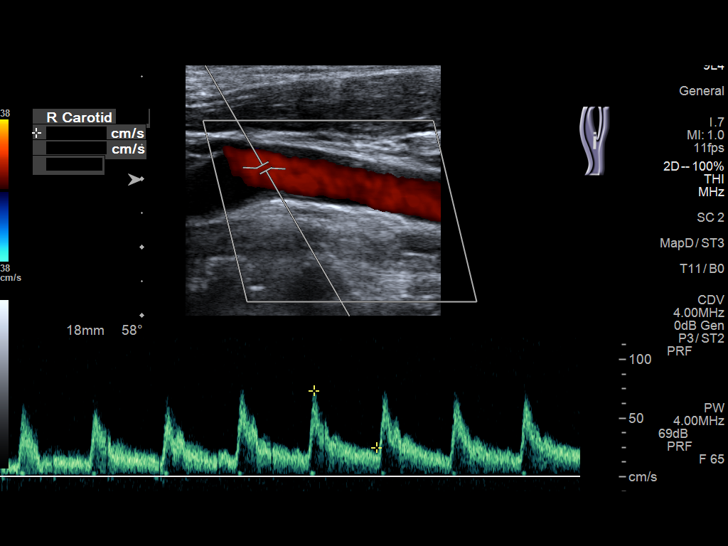
[im 23/65]
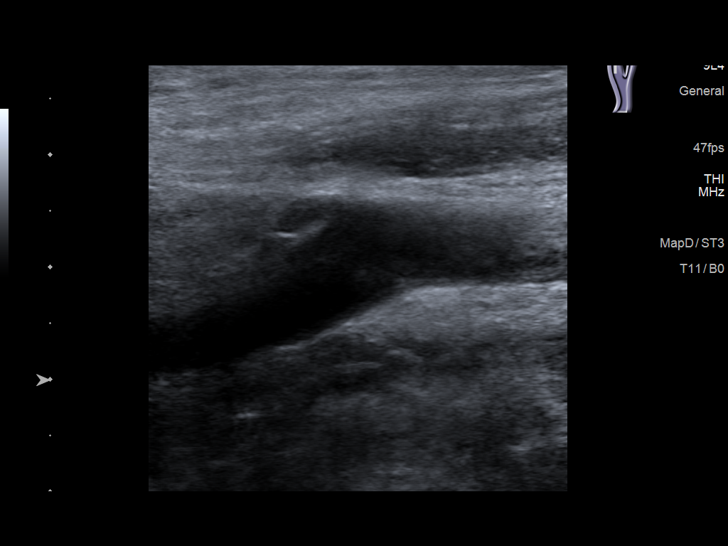
[im 28/65]
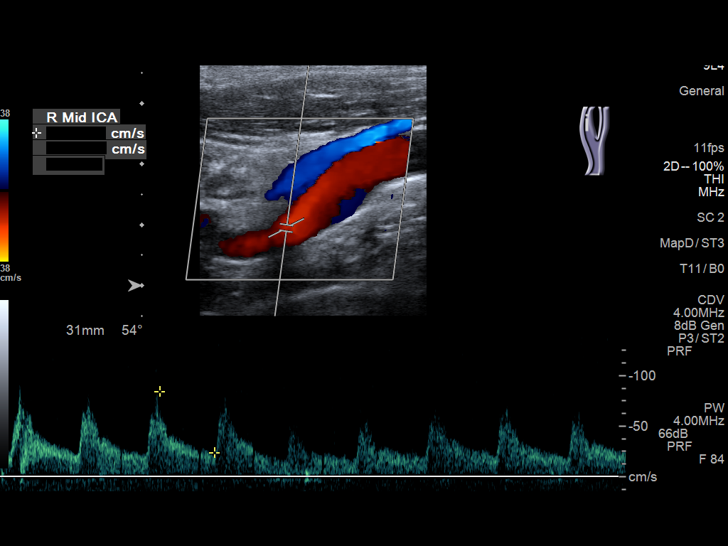
[im 34/65]
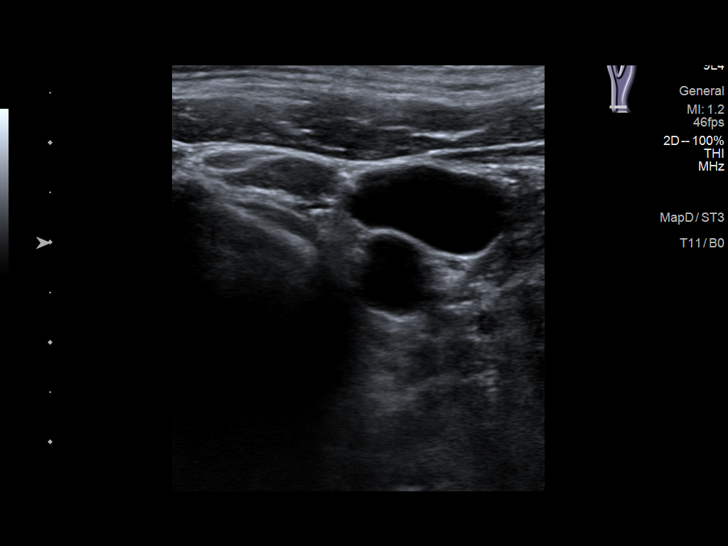
[im 37/65]
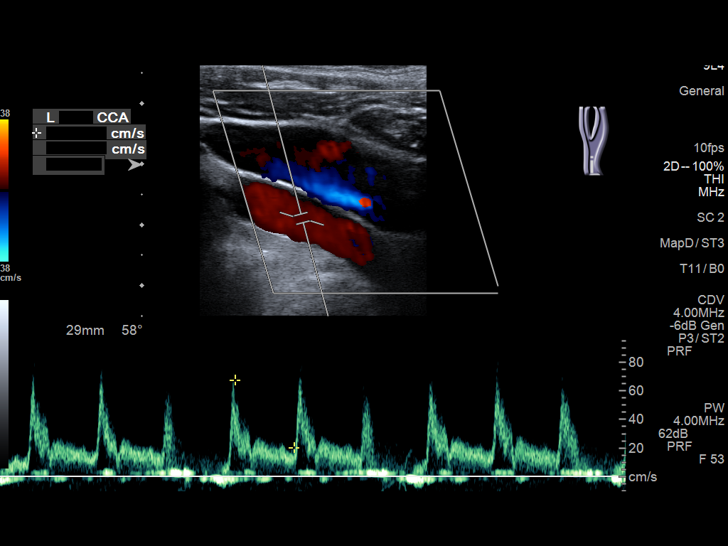
[im 42/65]
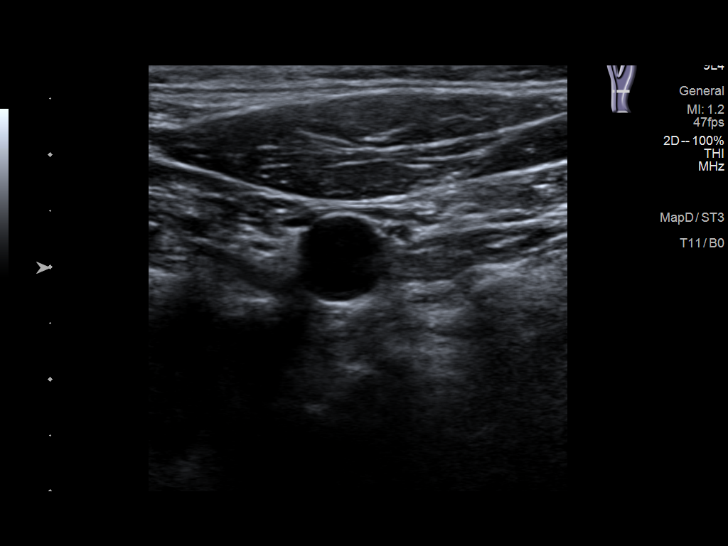
[im 48/65]
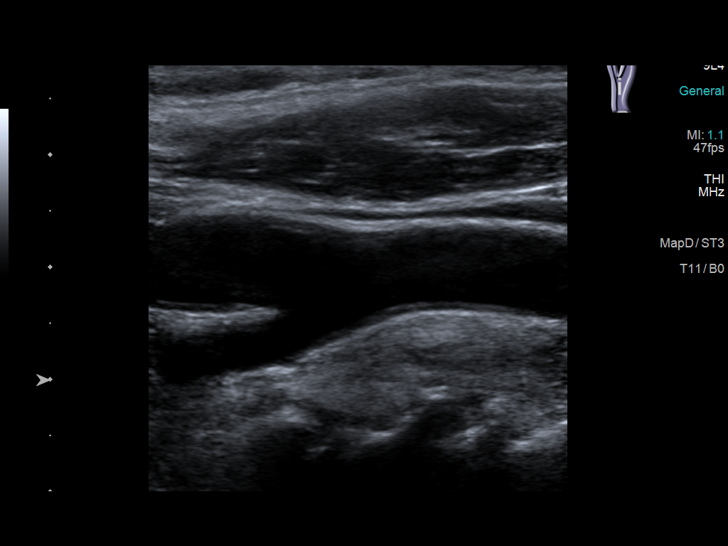
[im 53/65]
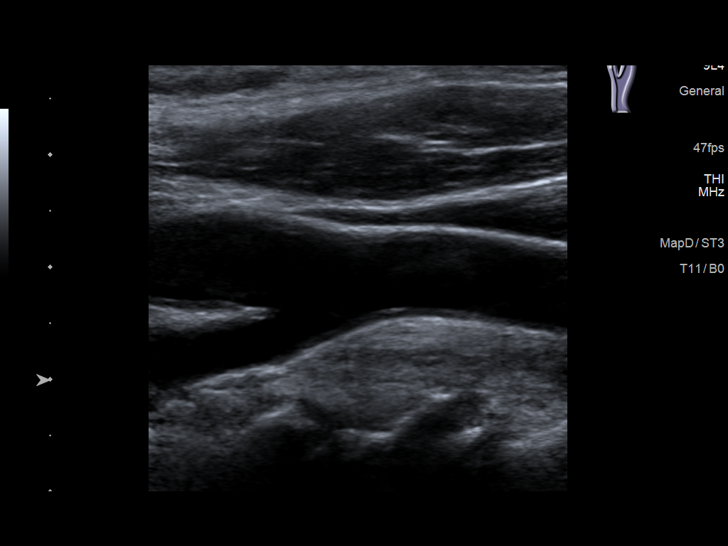
[im 59/65]
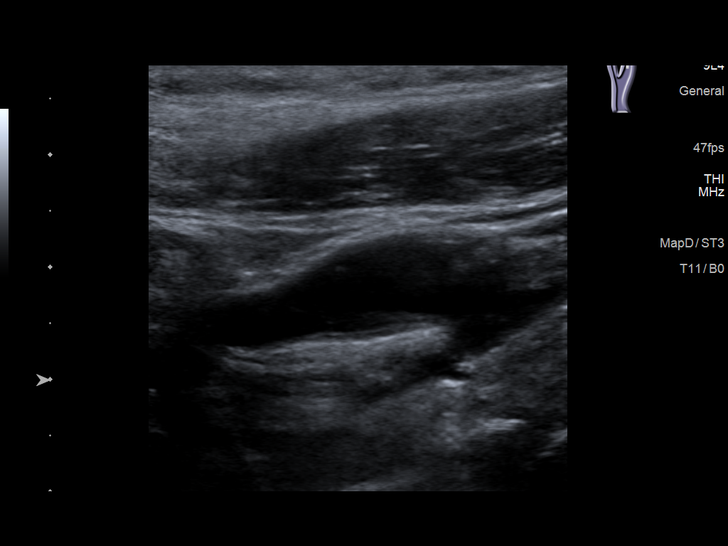
[im 65/65]
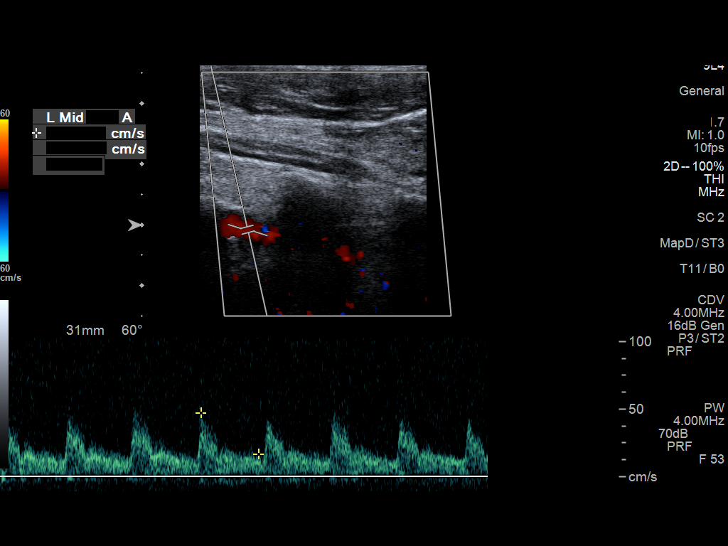

[13 of 24 positions shown; findings below may reference images not displayed]

FINDINGS: Criteria: Quantification of carotid stenosis is based on velocity
parameters that correlate the residual internal carotid diameter
with NASCET-based stenosis levels, using the diameter of the distal
internal carotid lumen as the denominator for stenosis measurement.

The following velocity measurements were obtained:

RIGHT

ICA: Peak systolic velocity 84 cm/sec, End diastolic velocity 24
cm/sec

CCA: Peak systolic velocity 97 cm/sec

SYSTOLIC ICA/CCA RATIO:  0

ECA: Peak systolic velocity 106 cm/sec

LEFT

ICA: Peak systolic velocity 57 cm/sec, End diastolic velocity 18
cm/sec

CCA: 89 cm/sec

SYSTOLIC ICA/CCA RATIO:

ECA: 118 cm/sec

RIGHT CAROTID ARTERY: No atherosclerotic plaque formation. No
significant tortuosity. Normal low resistance waveforms.

RIGHT VERTEBRAL ARTERY:  Antegrade flow.

LEFT CAROTID ARTERY: Mild atherosclerotic plaque formation the
carotid bulb. No significant tortuosity. Normal low resistance
waveforms.

LEFT VERTEBRAL ARTERY:  Antegrade flow.

Upper extremity non-invasive blood pressures:

Not obtained.
IMPRESSION: 1. Right carotid artery system: Patent without significant
atherosclerotic plaque formation.

2. Left carotid artery system: Less than 50% stenosis secondary to
mild atherosclerotic plaque formation at the carotid bulb.

3.  Vertebral artery system: Patent with antegrade flow bilaterally.

## 2022-03-21 DIAGNOSIS — H2513 Age-related nuclear cataract, bilateral: Secondary | ICD-10-CM | POA: Diagnosis not present

## 2022-04-10 ENCOUNTER — Ambulatory Visit (INDEPENDENT_AMBULATORY_CARE_PROVIDER_SITE_OTHER): Payer: No Typology Code available for payment source

## 2022-04-10 ENCOUNTER — Encounter: Payer: Self-pay | Admitting: Orthopedic Surgery

## 2022-04-10 ENCOUNTER — Ambulatory Visit (INDEPENDENT_AMBULATORY_CARE_PROVIDER_SITE_OTHER): Payer: No Typology Code available for payment source | Admitting: Orthopedic Surgery

## 2022-04-10 DIAGNOSIS — M109 Gout, unspecified: Secondary | ICD-10-CM | POA: Diagnosis not present

## 2022-04-10 DIAGNOSIS — I129 Hypertensive chronic kidney disease with stage 1 through stage 4 chronic kidney disease, or unspecified chronic kidney disease: Secondary | ICD-10-CM | POA: Diagnosis not present

## 2022-04-10 DIAGNOSIS — M4317 Spondylolisthesis, lumbosacral region: Secondary | ICD-10-CM

## 2022-04-10 DIAGNOSIS — F17211 Nicotine dependence, cigarettes, in remission: Secondary | ICD-10-CM | POA: Diagnosis not present

## 2022-04-10 DIAGNOSIS — Z6829 Body mass index (BMI) 29.0-29.9, adult: Secondary | ICD-10-CM | POA: Diagnosis not present

## 2022-04-10 DIAGNOSIS — N189 Chronic kidney disease, unspecified: Secondary | ICD-10-CM | POA: Diagnosis not present

## 2022-04-10 DIAGNOSIS — N400 Enlarged prostate without lower urinary tract symptoms: Secondary | ICD-10-CM | POA: Diagnosis not present

## 2022-04-10 DIAGNOSIS — M25551 Pain in right hip: Secondary | ICD-10-CM | POA: Diagnosis not present

## 2022-04-10 DIAGNOSIS — E785 Hyperlipidemia, unspecified: Secondary | ICD-10-CM | POA: Diagnosis not present

## 2022-04-10 DIAGNOSIS — E663 Overweight: Secondary | ICD-10-CM | POA: Diagnosis not present

## 2022-04-10 DIAGNOSIS — N529 Male erectile dysfunction, unspecified: Secondary | ICD-10-CM | POA: Diagnosis not present

## 2022-04-10 DIAGNOSIS — G4733 Obstructive sleep apnea (adult) (pediatric): Secondary | ICD-10-CM | POA: Diagnosis not present

## 2022-04-10 DIAGNOSIS — Z85828 Personal history of other malignant neoplasm of skin: Secondary | ICD-10-CM | POA: Diagnosis not present

## 2022-04-10 DIAGNOSIS — Z008 Encounter for other general examination: Secondary | ICD-10-CM | POA: Diagnosis not present

## 2022-04-10 DIAGNOSIS — K219 Gastro-esophageal reflux disease without esophagitis: Secondary | ICD-10-CM | POA: Diagnosis not present

## 2022-04-10 DIAGNOSIS — I700 Atherosclerosis of aorta: Secondary | ICD-10-CM | POA: Diagnosis not present

## 2022-04-10 MED ORDER — PREDNISONE 10 MG PO TABS: 10.0000 mg | ORAL_TABLET | Freq: Every day | ORAL | 0 refills | Status: DC

## 2022-04-10 NOTE — Progress Notes (Signed)
Office Visit Note   Patient: Mitchell Todd           Date of Birth: 1956/05/06           MRN: [id number] Visit Date: 04/10/2022              Requested by: Maury Dus, [address],  [place] [address] PCP: Maury Dus, MD  Chief Complaint  Patient presents with   Right Hip - Pain      HPI: Patient is a 66 year old gentleman who presents complaining of right buttocks pain that radiates down to his foot.  Patient states he has pain with walking and pain with sleeping.  Patient works in the [employer].  Assessment & Plan: Visit Diagnoses:  1. Pain in right hip   2. Spondylolisthesis, lumbosacral region     Plan: A prescription is called in for prednisone 10 mg with breakfast.  Discussed that we could pursue physical therapy patient would like to reevaluate in 4 weeks.  Follow-Up Instructions: No follow-ups on file.   Ortho Exam  Patient is alert, oriented, no adenopathy, well-dressed, normal affect, normal respiratory effort. Examination patient has no pain with range of motion of the hip knee or ankle.  He has a positive straight leg raise on the right with hamstring tightness no focal motor weakness.  Imaging: XR Lumbar Spine 2-3 Views  Result Date: 04/10/2022 2 view radiographs of the lumbar spine shows a pars defect with a grade 1 spondylolisthesis at L5-S1.  There is mild calcification of the aorta and disc space narrowing.  XR HIP UNILAT W OR W/O PELVIS 2-3 VIEWS RIGHT  Result Date: 04/10/2022 2 view radiographs of the right hip shows subcondylar sclerosis with a congruent joint space equal in both hips.  No images are attached to the encounter.  Labs: No results found for: "HGBA1C", "ESRSEDRATE", "CRP", "LABURIC", "REPTSTATUS", "GRAMSTAIN", "CULT", "LABORGA"   No results found for: "ALBUMIN", "PREALBUMIN", "CBC"  No results found for: "MG" No results found for: "VD25OH"  No results found for: "PREALBUMIN"    Latest Ref Rng  & Units 11/22/2020    2:44 AM 11/21/2020    9:29 AM  CBC EXTENDED  WBC 4.0 - 10.5 K/uL 9.2    RBC 4.22 - 5.81 MIL/uL 4.32    Hemoglobin 13.0 - 17.0 g/dL 13.6  15.0   HCT 39.0 - 52.0 % 40.3  44.0   Platelets 150 - 400 K/uL 188       There is no height or weight on file to calculate BMI.  Orders:  Orders Placed This Encounter  Procedures   XR HIP UNILAT W OR W/O PELVIS 2-3 VIEWS RIGHT   XR Lumbar Spine 2-3 Views   No orders of the defined types were placed in this encounter.    Procedures: No procedures performed  Clinical Data: No additional findings.  ROS:  All other systems negative, except as noted in the HPI. Review of Systems  Objective: Vital Signs: There were no vitals taken for this visit.  Specialty Comments:  No specialty comments available.  PMFS History: Patient Active Problem List   Diagnosis Date Noted   BPH (benign prostatic hyperplasia) 11/21/2020   CAD 11/14/2007   ESOPHAGEAL STRICTURE 11/14/2007   GERD 11/14/2007   Past Medical History:  Diagnosis Date   Arthritis    knees oa   Concussion 04/2018   no residual from   Eczema    Elevated cholesterol  GERD (gastroesophageal reflux disease)    Gout    Hypertension    Sleep apnea    ues cpap set on 7  uses 2 oe 3 night s per week   Urinary frequency    Wears glasses     History reviewed. No pertinent family history.  Past Surgical History:  Procedure Laterality Date   APPENDECTOMY  as child   foot cyst removed Right 2012   TONSILLECTOMY  as child   TRANSURETHRAL RESECTION OF PROSTATE N/A 11/21/2020   Procedure: TRANSURETHRAL RESECTION OF THE PROSTATE (TURP)/ BIPOLAR;  Surgeon: Janith Lima, MD;  Location: [facility];  Service: Urology;  Laterality: N/A;   trigger finger surgeyr  left thumb 2012   Social History   Occupational History   Not on file  Tobacco Use   Smoking status: Former   Smokeless tobacco: Never  Substance and Sexual Activity   Alcohol use:  Not on file   Drug use: Not on file   Sexual activity: Not on file

## 2022-04-18 ENCOUNTER — Telehealth: Payer: Self-pay | Admitting: Orthopedic Surgery

## 2022-04-18 NOTE — Telephone Encounter (Signed)
Patient would like to discuss his medication.  The best contact number [phone]

## 2022-04-18 NOTE — Telephone Encounter (Signed)
Called number below and message states not a working number. Called [phone] and line is busy. Will hold and try again later.

## 2022-04-19 DIAGNOSIS — R7303 Prediabetes: Secondary | ICD-10-CM | POA: Diagnosis not present

## 2022-04-19 DIAGNOSIS — I10 Essential (primary) hypertension: Secondary | ICD-10-CM | POA: Diagnosis not present

## 2022-04-19 DIAGNOSIS — R0602 Shortness of breath: Secondary | ICD-10-CM | POA: Diagnosis not present

## 2022-04-19 DIAGNOSIS — R062 Wheezing: Secondary | ICD-10-CM | POA: Diagnosis not present

## 2022-04-19 DIAGNOSIS — E291 Testicular hypofunction: Secondary | ICD-10-CM | POA: Diagnosis not present

## 2022-04-19 DIAGNOSIS — G250 Essential tremor: Secondary | ICD-10-CM | POA: Diagnosis not present

## 2022-04-19 DIAGNOSIS — E7800 Pure hypercholesterolemia, unspecified: Secondary | ICD-10-CM | POA: Diagnosis not present

## 2022-04-19 DIAGNOSIS — Z23 Encounter for immunization: Secondary | ICD-10-CM | POA: Diagnosis not present

## 2022-04-19 DIAGNOSIS — N400 Enlarged prostate without lower urinary tract symptoms: Secondary | ICD-10-CM | POA: Diagnosis not present

## 2022-04-19 DIAGNOSIS — N1831 Chronic kidney disease, stage 3a: Secondary | ICD-10-CM | POA: Diagnosis not present

## 2022-04-19 DIAGNOSIS — M109 Gout, unspecified: Secondary | ICD-10-CM | POA: Diagnosis not present

## 2022-04-19 DIAGNOSIS — G4733 Obstructive sleep apnea (adult) (pediatric): Secondary | ICD-10-CM | POA: Diagnosis not present

## 2022-04-26 ENCOUNTER — Telehealth: Payer: Self-pay | Admitting: Orthopedic Surgery

## 2022-04-26 NOTE — Telephone Encounter (Signed)
Patient called advised the prednisone did not work and he has been on it for 2 weeks now. Patient asked when was the medication suppose to kick in? Patient said he called last week and have not heard from anyone. Patient said he need something that will help him.   The number to [phone]

## 2022-04-27 ENCOUNTER — Other Ambulatory Visit: Payer: Self-pay

## 2022-04-27 DIAGNOSIS — M25551 Pain in right hip: Secondary | ICD-10-CM

## 2022-04-27 DIAGNOSIS — M4317 Spondylolisthesis, lumbosacral region: Secondary | ICD-10-CM

## 2022-04-27 NOTE — Telephone Encounter (Signed)
I called pt and you saw in the office 04/12/22. Prednisone not helping with LBP and right hip pain. You had suggested in note PT referral which I made today. Advised OTC pain meds and Thermacare heating patch. Any other suggestions?

## 2022-05-10 ENCOUNTER — Ambulatory Visit: Payer: No Typology Code available for payment source | Admitting: Orthopedic Surgery

## 2022-05-14 ENCOUNTER — Ambulatory Visit (INDEPENDENT_AMBULATORY_CARE_PROVIDER_SITE_OTHER): Payer: No Typology Code available for payment source | Admitting: Physical Therapy

## 2022-05-14 ENCOUNTER — Other Ambulatory Visit: Payer: Self-pay

## 2022-05-14 ENCOUNTER — Encounter: Payer: Self-pay | Admitting: Orthopedic Surgery

## 2022-05-14 ENCOUNTER — Ambulatory Visit (INDEPENDENT_AMBULATORY_CARE_PROVIDER_SITE_OTHER): Payer: No Typology Code available for payment source | Admitting: Orthopedic Surgery

## 2022-05-14 ENCOUNTER — Encounter: Payer: Self-pay | Admitting: Physical Therapy

## 2022-05-14 DIAGNOSIS — M6281 Muscle weakness (generalized): Secondary | ICD-10-CM | POA: Diagnosis not present

## 2022-05-14 DIAGNOSIS — M5459 Other low back pain: Secondary | ICD-10-CM | POA: Diagnosis not present

## 2022-05-14 DIAGNOSIS — M4317 Spondylolisthesis, lumbosacral region: Secondary | ICD-10-CM | POA: Diagnosis not present

## 2022-05-14 DIAGNOSIS — R29898 Other symptoms and signs involving the musculoskeletal system: Secondary | ICD-10-CM | POA: Diagnosis not present

## 2022-05-14 DIAGNOSIS — M25551 Pain in right hip: Secondary | ICD-10-CM

## 2022-05-14 NOTE — Progress Notes (Signed)
Office Visit Note   Patient: Mitchell Todd           Date of Birth: August 14, 1955           MRN: [id number] Visit Date: 05/14/2022              Requested by: Maury Dus, [address] [place],  [place] [id number] PCP: Maury Dus, MD  Chief Complaint  Patient presents with   Right Hip - Pain      HPI: Patient is a 66 year old gentleman who presents in follow-up for right-sided radicular pain with a spondylolisthesis with pars defect L5-S1.  Patient states that he had minimal relief with the prednisone.  He is to start physical therapy this morning.  Assessment & Plan: Visit Diagnoses:  1. Spondylolisthesis, lumbosacral region     Plan: Plan for physical therapy lumbar spine with pars defect and spondylolisthesis L5-S1.  Discussed that if he does not get sufficient relief with therapy we would proceed with MRI scan of his lumbar spine.  Follow-Up Instructions: Return in about 4 weeks (around 06/11/2022).   Ortho Exam  Patient is alert, oriented, no adenopathy, well-dressed, normal affect, normal respiratory effort. Examination patient sits leaning to the left.  He has a negative straight leg raise on the right no focal motor weakness in the right lower extremity.  Radiograph shows a pars defect at L5-S1 with a grade 1 spondylolisthesis.  Imaging: No results found. No images are attached to the encounter.  Labs: No results found for: "HGBA1C", "ESRSEDRATE", "CRP", "LABURIC", "REPTSTATUS", "GRAMSTAIN", "CULT", "LABORGA"   No results found for: "ALBUMIN", "PREALBUMIN", "CBC"  No results found for: "MG" No results found for: "VD25OH"  No results found for: "PREALBUMIN"    Latest Ref Rng & Units 11/22/2020    2:44 AM 11/21/2020    9:29 AM  CBC EXTENDED  WBC 4.0 - 10.5 K/uL 9.2    RBC 4.22 - 5.81 MIL/uL 4.32    Hemoglobin 13.0 - 17.0 g/dL 13.6  15.0   HCT 39.0 - 52.0 % 40.3  44.0   Platelets 150 - 400 K/uL 188       There is no height or weight  on file to calculate BMI.  Orders:  No orders of the defined types were placed in this encounter.  No orders of the defined types were placed in this encounter.    Procedures: No procedures performed  Clinical Data: No additional findings.  ROS:  All other systems negative, except as noted in the HPI. Review of Systems  Objective: Vital Signs: There were no vitals taken for this visit.  Specialty Comments:  No specialty comments available.  PMFS History: Patient Active Problem List   Diagnosis Date Noted   BPH (benign prostatic hyperplasia) 11/21/2020   CAD 11/14/2007   ESOPHAGEAL STRICTURE 11/14/2007   GERD 11/14/2007   Past Medical History:  Diagnosis Date   Arthritis    knees oa   Concussion 04/2018   no residual from   Eczema    Elevated cholesterol    GERD (gastroesophageal reflux disease)    Gout    Hypertension    Sleep apnea    ues cpap set on 7  uses 2 oe 3 night s per week   Urinary frequency    Wears glasses     History reviewed. No pertinent family history.  Past Surgical History:  Procedure Laterality Date   APPENDECTOMY  as child   foot cyst removed  Right 2012   TONSILLECTOMY  as child   TRANSURETHRAL RESECTION OF PROSTATE N/A 11/21/2020   Procedure: TRANSURETHRAL RESECTION OF THE PROSTATE (TURP)/ BIPOLAR;  Surgeon: Janith Lima, MD;  Location: [facility];  Service: Urology;  Laterality: N/A;   trigger finger surgeyr  left thumb 2012   Social History   Occupational History   Not on file  Tobacco Use   Smoking status: Former   Smokeless tobacco: Never  Substance and Sexual Activity   Alcohol use: Not on file   Drug use: Not on file   Sexual activity: Not on file

## 2022-05-14 NOTE — Therapy (Signed)
OUTPATIENT PHYSICAL THERAPY THORACOLUMBAR EVALUATION   Patient Name: Mitchell Todd MRN: [id number] DOB:07-13-1956, 66 y.o., male Today's Date: 05/14/2022   PT End of Session - 05/14/22 0958     Visit Number 1    Number of Visits 12    Date for PT Re-Evaluation 06/25/22    Authorization Type [employer], $15 copay    PT Start Time 0910    PT Stop Time 0948    PT Time Calculation (min) 38 min    Activity Tolerance Patient tolerated treatment well    Behavior During Therapy [facility] for tasks assessed/performed             Past Medical History:  Diagnosis Date   Arthritis    knees oa   Concussion 04/2018   no residual from   Eczema    Elevated cholesterol    GERD (gastroesophageal reflux disease)    Gout    Hypertension    Sleep apnea    ues cpap set on 7  uses 2 oe 3 night s per week   Urinary frequency    Wears glasses    Past Surgical History:  Procedure Laterality Date   APPENDECTOMY  as child   foot cyst removed Right 2012   TONSILLECTOMY  as child   TRANSURETHRAL RESECTION OF PROSTATE N/A 11/21/2020   Procedure: TRANSURETHRAL RESECTION OF THE PROSTATE (TURP)/ BIPOLAR;  Surgeon: Janith Lima, MD;  Location: [facility];  Service: Urology;  Laterality: N/A;   trigger finger surgeyr  left thumb 2012   Patient Active Problem List   Diagnosis Date Noted   BPH (benign prostatic hyperplasia) 11/21/2020   CAD 11/14/2007   ESOPHAGEAL STRICTURE 11/14/2007   GERD 11/14/2007    PCP: Maury Dus, MD  REFERRING PROVIDER: Newt Minion, MD   REFERRING DIAG: [phone] (ICD-10-CM) - Pain in right hip M43.17 (ICD-10-CM) - Spondylolisthesis, lumbosacral region   Rationale for Evaluation and Treatment Rehabilitation  THERAPY DIAG:  Other low back pain - Plan: PT plan of care cert/re-cert  Muscle weakness (generalized) - Plan: PT plan of care cert/re-cert  Other symptoms and signs involving the musculoskeletal system - Plan: PT plan of care  cert/re-cert  Pain in right hip - Plan: PT plan of care cert/re-cert  ONSET DATE: chronic x 1 year, exacerbation x 2 months  SUBJECTIVE:                                                                                                                                                                                           SUBJECTIVE STATEMENT: Patient is a 66 year old gentleman who presents complaining  of right buttocks pain that radiates down to his foot. Patient states he has pain with walking and pain with sleeping. Patient works in the [employer].  He reports most of his pain is on the side of his hip and anterior shin.  He reports intermittent pain for at least a year, with exacerbation over the past few months.  Reports prednisone was ineffective.  PERTINENT HISTORY:  OA, hx concussion, OSA, gout, HTN  PAIN:  Are you having pain? Yes: NPRS scale: 2 currently, up to 7-8, at best 1/10 Pain location: Rt hip, Rt ant shin and foot (numbness) Pain description: sharp Aggravating factors: walking (various times) Relieving factors: sitting/rest, advil (settles pretty quickly)   PRECAUTIONS: None  WEIGHT BEARING RESTRICTIONS: No  FALLS:  Has patient fallen in last 6 months? 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 2 view radiographs of the lumbar spine shows a pars defect with a grade 1 spondylolisthesis at L5-S1. There is mild calcification of the aorta and disc space narrowing.   PATIENT  SURVEYS:  05/14/22: FOTO 49 (predicted 22)  SCREENING FOR RED FLAGS: Bowel or bladder incontinence: No Spinal tumors: No Cauda equina syndrome: No Compression fracture: No   COGNITION: Overall cognitive status: Within functional limits for tasks assessed     SENSATION: Reports Rt foot numbness  MUSCLE LENGTH: 05/14/22: significant tightness bil hamstrings, piriformis  POSTURE: rounded shoulders, forward head, and decreased lumbar lordosis  PALPATION: 05/14/22: hypomobility noted in lumbar spine, unable to reproduce pain with palpation  LUMBAR ROM:   AROM eval  Flexion Limited 25%  Extension WNL  Right lateral flexion WNL  Left lateral flexion WNL  Right quadrant WNL with reproduction of pain  Left rotation WNL   (Blank rows = not tested)  LOWER EXTREMITY MMT:    MMT Right eval Left eval  Hip flexion 4/5 5/5  Hip extension    Hip abduction    Hip adduction    Hip internal rotation    Hip external rotation    Knee flexion 5/5 5/5  Knee extension 5/5 5/5  Ankle dorsiflexion 4/5 5/5  Ankle plantarflexion    Ankle inversion    Ankle eversion     (Blank rows = not tested)  LUMBAR SPECIAL TESTS:  05/14/22 Slump test: increase in symptoms on Rt; increase in pain with reduction of neural tension; Lt negaitve   GAIT: 05/14/22: amb independently without significant deviations    TODAY'S TREATMENT:  [facility] Adult PT Treatment:  DATE: 05/14/22 Therex See HEP -    PATIENT EDUCATION:  Education details: HEP Person educated: Patient Education method: Explanation, Demonstration, and Handouts Education comprehension: verbalized understanding, returned demonstration, and needs further education   HOME EXERCISE PROGRAM: Access Code: [email or web address] URL: [email or web address] Date: 05/14/2022 Prepared by: Faustino Congress  Exercises - Supine Lower Trunk Rotation  - 2 x daily - 7 x weekly - 1 sets - 3 reps - 30 sec hold - Hooklying Single Knee to Chest  - 2 x daily - 7 x weekly - 1 sets - 3 reps - 30 sec hold - Supine Piriformis Stretch with Foot on Ground  - 2 x daily - 7 x weekly - 1 sets - 3 reps - 30 sec hold - Seated Hamstring Stretch  - 2 x daily - 7 x weekly - 1 sets - 3 reps - 30 sec hold - Sidelying Hip Circles  - 2 x daily - 7 x weekly - 1 sets - 10 reps  ASSESSMENT:  CLINICAL IMPRESSION: Patient is a 66 y.o. male who was seen today for physical therapy evaluation and treatment for Rt side LBP and hip pain. He demonstrates decreased flexibility and mild strength deficits affecting functional mobility.  He will benefit from PT to address deficits listed.   OBJECTIVE IMPAIRMENTS: decreased mobility, decreased ROM, decreased strength, hypomobility, impaired flexibility, postural dysfunction, and pain.   ACTIVITY LIMITATIONS: carrying, lifting, bending, standing, squatting, and locomotion level  PARTICIPATION LIMITATIONS: meal prep, cleaning, laundry, driving, community activity, and occupation  PERSONAL FACTORS: 3+ comorbidities: OA, hx concussion, OSA, gout, HTN  are also affecting patient's functional outcome.   REHAB POTENTIAL: Good  CLINICAL DECISION MAKING: Evolving/moderate complexity  EVALUATION COMPLEXITY: Moderate   GOALS: Goals reviewed with patient? Yes  SHORT TERM GOALS: Target date: 06/04/2022  Independent with initial HEP Goal status: INITIAL  LONG TERM GOALS: Target date: 06/25/2022  Independent with final HEP Goal status: INITIAL  2.  FOTO score improved to 56 Goal status: INITIAL  3.  RLE strength improved to 5/5 for improved function and mobility Goal status: INIITAL  4.  Report pain < 4/10 with standing and walking for improved function Goal status: INITIAL  5.  Report centralization of symptoms for improved tolerance to work activities Goal status:  INITIAL   PLAN: PT FREQUENCY: 1-2x/week  PT DURATION: 6 weeks  PLANNED INTERVENTIONS: Therapeutic exercises, Therapeutic activity, Neuromuscular re-education, Gait training, Patient/Family education, Self Care, Joint mobilization, Joint manipulation, Aquatic Therapy, Dry Needling, Electrical stimulation, Spinal manipulation, Spinal mobilization, Cryotherapy, Moist heat, Taping, Traction, Ionotophoresis '4mg'$ /ml Dexamethasone, Manual therapy, and Re-evaluation.  PLAN FOR NEXT SESSION: review HEP, general flexibility and hip/core strengthening; consider traction and/or manipulation   Laureen Abrahams, PT, DPT 05/14/22 9:59 AM

## 2022-05-21 ENCOUNTER — Encounter: Payer: Self-pay | Admitting: Physical Therapy

## 2022-05-21 ENCOUNTER — Ambulatory Visit (INDEPENDENT_AMBULATORY_CARE_PROVIDER_SITE_OTHER): Payer: No Typology Code available for payment source | Admitting: Physical Therapy

## 2022-05-21 DIAGNOSIS — M6281 Muscle weakness (generalized): Secondary | ICD-10-CM

## 2022-05-21 DIAGNOSIS — R29898 Other symptoms and signs involving the musculoskeletal system: Secondary | ICD-10-CM

## 2022-05-21 DIAGNOSIS — M25551 Pain in right hip: Secondary | ICD-10-CM | POA: Diagnosis not present

## 2022-05-21 DIAGNOSIS — M5459 Other low back pain: Secondary | ICD-10-CM | POA: Diagnosis not present

## 2022-05-21 NOTE — Therapy (Signed)
OUTPATIENT PHYSICAL THERAPY TREATMENT NOTE   Patient Name: Mitchell Todd MRN: [id number] DOB:19-Jun-1956, 66 y.o., male Today's Date: 05/21/2022  END OF SESSION:   PT End of Session - 05/21/22 0803     Visit Number 2    Number of Visits 12    Date for PT Re-Evaluation 06/25/22    Authorization Type [employer], $15 copay    PT Start Time 0801    PT Stop Time 0840    PT Time Calculation (min) 39 min    Activity Tolerance Patient tolerated treatment well    Behavior During Therapy [facility] for tasks assessed/performed             Past Medical History:  Diagnosis Date   Arthritis    knees oa   Concussion 04/2018   no residual from   Eczema    Elevated cholesterol    GERD (gastroesophageal reflux disease)    Gout    Hypertension    Sleep apnea    ues cpap set on 7  uses 2 oe 3 night s per week   Urinary frequency    Wears glasses    Past Surgical History:  Procedure Laterality Date   APPENDECTOMY  as child   foot cyst removed Right 2012   TONSILLECTOMY  as child   TRANSURETHRAL RESECTION OF PROSTATE N/A 11/21/2020   Procedure: TRANSURETHRAL RESECTION OF THE PROSTATE (TURP)/ BIPOLAR;  Surgeon: Janith Lima, MD;  Location: [facility];  Service: Urology;  Laterality: N/A;   trigger finger surgeyr  left thumb 2012   Patient Active Problem List   Diagnosis Date Noted   BPH (benign prostatic hyperplasia) 11/21/2020   CAD 11/14/2007   ESOPHAGEAL STRICTURE 11/14/2007   GERD 11/14/2007     THERAPY DIAG:  Other low back pain  Muscle weakness (generalized)  Other symptoms and signs involving the musculoskeletal system  Pain in right hip  PCP: Maury Dus, MD  REFERRING PROVIDER: Newt Minion, MD   REFERRING DIAG: [phone] (ICD-10-CM) - Pain in right hip M43.17 (ICD-10-CM) - Spondylolisthesis, lumbosacral region   Rationale for Evaluation and Treatment Rehabilitation  EVAL THERAPY DIAG:  Other low back pain - Plan: PT plan of care  cert/re-cert  Muscle weakness (generalized) - Plan: PT plan of care cert/re-cert  Other symptoms and signs involving the musculoskeletal system - Plan: PT plan of care cert/re-cert  Pain in right hip - Plan: PT plan of care cert/re-cert  ONSET DATE: chronic x 1 year, exacerbation x 2 months  SUBJECTIVE:  SUBJECTIVE STATEMENT: Doing exercises every morning; only able to get in twice a day a few times.  Pain is "about the same."  PERTINENT HISTORY:  OA, hx concussion, OSA, gout, HTN  PAIN:  Are you having pain? Yes: NPRS scale: 1 currently, up to 7-8, at best 1/10 Pain location: Rt hip, Rt ant shin and foot (numbness) Pain description: sharp Aggravating factors: walking (various times) Relieving factors: sitting/rest, advil (settles pretty quickly)   PRECAUTIONS: None  WEIGHT BEARING RESTRICTIONS: No  FALLS:  Has patient fallen in last 6 months? 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 2 view radiographs of the lumbar spine shows a pars defect with a grade 1 spondylolisthesis at L5-S1. There is mild calcification of the aorta and disc space narrowing.   PATIENT SURVEYS:  05/14/22: FOTO 49 (predicted 56)   SENSATION: Reports Rt foot numbness  MUSCLE LENGTH: 05/14/22: significant tightness bil  hamstrings, piriformis  POSTURE: rounded shoulders, forward head, and decreased lumbar lordosis  PALPATION: 05/14/22: hypomobility noted in lumbar spine, unable to reproduce pain with palpation  LUMBAR ROM:   AROM eval  Flexion Limited 25%  Extension WNL  Right lateral flexion WNL  Left lateral flexion WNL  Right quadrant WNL with reproduction of pain  Left rotation WNL   (Blank rows = not tested)  LOWER EXTREMITY MMT:    MMT Right eval Left eval  Hip flexion 4/5 5/5  Hip extension    Hip abduction    Hip adduction    Hip internal rotation    Hip external rotation    Knee flexion 5/5 5/5  Knee extension 5/5 5/5  Ankle dorsiflexion 4/5 5/5  Ankle plantarflexion    Ankle inversion    Ankle eversion     (Blank rows = not tested)  LUMBAR SPECIAL TESTS:  05/14/22 Slump test: increase in symptoms on Rt; increase in pain with reduction of neural tension; Lt negaitve   GAIT: 05/14/22: amb independently without significant deviations    TODAY'S TREATMENT:  [facility] Adult PT Treatment:                                                                                                                             05/21/22 Therex NuStep L4 x 5 min Lower trunk rotation 3x30 sec bil Single knee to chest 3x30 sec bil Seated hamstring stretch 3x30 sec bil Supine piriformis stretch 3x30 sec Sidelying hip circles x10 reps CW/CCW bil Slump stretch neural flossing 10 x 5 sec hold each position  Prone opposite arm/leg raise with 5 sec hold x10 reps bil Childs pose 3x10 sec hold Rows L4 x10 reps; 5 sec hold   DATE: 05/14/22 Therex See HEP -    PATIENT EDUCATION:  Education details: HEP Person educated: Patient Education method: Consulting civil engineer, Demonstration, and Handouts Education comprehension: verbalized understanding, returned demonstration, and needs further education   HOME EXERCISE PROGRAM: Access Code: [email or web address] URL: [email or web address] Date:  05/14/2022 Prepared by: Faustino Congress  Exercises - Supine Lower Trunk Rotation  - 2 x daily - 7 x weekly - 1 sets - 3 reps - 30 sec hold - Hooklying Single Knee to Chest  - 2 x daily - 7 x weekly - 1 sets - 3 reps - 30 sec hold - Supine Piriformis Stretch with Foot on Ground  - 2 x daily - 7 x weekly - 1 sets - 3 reps - 30 sec hold - Seated Hamstring Stretch  - 2 x daily - 7 x weekly - 1 sets - 3 reps - 30 sec hold - Sidelying Hip Circles  - 2 x daily - 7 x weekly - 1 sets - 10 reps  ASSESSMENT:  CLINICAL IMPRESSION: Pt reporting no pain after session today so feel exercises are helping at this time.  Will continue to benefit from PT to maximize function.  OBJECTIVE IMPAIRMENTS: decreased mobility, decreased ROM, decreased strength, hypomobility, impaired flexibility, postural dysfunction, and pain.   ACTIVITY LIMITATIONS: carrying, lifting, bending, standing, squatting, and locomotion level  PARTICIPATION LIMITATIONS: meal prep, cleaning, laundry, driving, community activity, and occupation  PERSONAL FACTORS: 3+ comorbidities: OA, hx concussion, OSA, gout, HTN are also affecting patient's functional outcome.   REHAB POTENTIAL: Good  CLINICAL DECISION MAKING: Evolving/moderate complexity  EVALUATION COMPLEXITY: Moderate   GOALS: Goals reviewed with patient? Yes  SHORT TERM GOALS: Target date: 06/04/2022  Independent with initial HEP Goal status: INITIAL  LONG TERM GOALS: Target date: 06/25/2022  Independent with final HEP Goal status: INITIAL  2.  FOTO score improved to 56 Goal status: INITIAL  3.  RLE strength improved to 5/5 for improved function and mobility Goal status: INIITAL  4.  Report pain < 4/10 with standing and walking for improved function Goal status: INITIAL  5.  Report centralization of symptoms for improved tolerance to work activities Goal status: INITIAL   PLAN: PT FREQUENCY: 1-2x/week  PT DURATION: 6 weeks  PLANNED INTERVENTIONS:  Therapeutic exercises, Therapeutic activity, Neuromuscular re-education, Gait training, Patient/Family education, Self Care, Joint mobilization, Joint manipulation, Aquatic Therapy, Dry Needling, Electrical stimulation, Spinal manipulation, Spinal mobilization, Cryotherapy, Moist heat, Taping, Traction, Ionotophoresis '4mg'$ /ml Dexamethasone, Manual therapy, and Re-evaluation.  PLAN FOR NEXT SESSION: review/update HEP PRN, general flexibility and hip/core strengthening; consider traction and/or manipulation    Laureen Abrahams, PT, DPT 05/21/22 8:39 AM

## 2022-05-25 ENCOUNTER — Encounter: Payer: No Typology Code available for payment source | Admitting: Physical Therapy

## 2022-05-25 NOTE — Therapy (Incomplete)
OUTPATIENT PHYSICAL THERAPY TREATMENT NOTE   Patient Name: Mitchell Todd MRN: [id number] DOB:25-Jul-1956, 66 y.o., male Today's Date: 05/25/2022  END OF SESSION:     Past Medical History:  Diagnosis Date   Arthritis    knees oa   Concussion 04/2018   no residual from   Eczema    Elevated cholesterol    GERD (gastroesophageal reflux disease)    Gout    Hypertension    Sleep apnea    ues cpap set on 7  uses 2 oe 3 night s per week   Urinary frequency    Wears glasses    Past Surgical History:  Procedure Laterality Date   APPENDECTOMY  as child   foot cyst removed Right 2012   TONSILLECTOMY  as child   TRANSURETHRAL RESECTION OF PROSTATE N/A 11/21/2020   Procedure: TRANSURETHRAL RESECTION OF THE PROSTATE (TURP)/ BIPOLAR;  Surgeon: Janith Lima, MD;  Location: [facility];  Service: Urology;  Laterality: N/A;   trigger finger surgeyr  left thumb 2012   Patient Active Problem List   Diagnosis Date Noted   BPH (benign prostatic hyperplasia) 11/21/2020   CAD 11/14/2007   ESOPHAGEAL STRICTURE 11/14/2007   GERD 11/14/2007     THERAPY DIAG:  No diagnosis found.  PCP: Maury Dus, MD  REFERRING PROVIDER: Newt Minion, MD   REFERRING DIAG: [phone] (ICD-10-CM) - Pain in right hip M43.17 (ICD-10-CM) - Spondylolisthesis, lumbosacral region   Rationale for Evaluation and Treatment Rehabilitation  EVAL THERAPY DIAG:  Other low back pain - Plan: PT plan of care cert/re-cert  Muscle weakness (generalized) - Plan: PT plan of care cert/re-cert  Other symptoms and signs involving the musculoskeletal system - Plan: PT plan of care cert/re-cert  Pain in right hip - Plan: PT plan of care cert/re-cert  ONSET DATE: chronic x 1 year, exacerbation x 2 months  SUBJECTIVE:                                                                                                                                                                                            SUBJECTIVE STATEMENT: *** Doing exercises every morning; only able to get in twice a day a few times.  Pain is "about the same."  PERTINENT HISTORY:  OA, hx concussion, OSA, gout, HTN  PAIN:  Are you having pain? Yes: NPRS scale: 1 currently, up to 7-8, at best 1/10 Pain location: Rt hip, Rt ant shin and foot (numbness) Pain description: sharp Aggravating factors: walking (various times) Relieving factors: sitting/rest, advil (settles pretty quickly)   PRECAUTIONS: None  WEIGHT BEARING RESTRICTIONS: No  FALLS:  Has patient fallen in last 6 months? 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 2 view radiographs of the lumbar spine shows a pars defect with a grade 1 spondylolisthesis at L5-S1. There is mild calcification of the aorta and disc space narrowing.   PATIENT SURVEYS:  05/14/22: FOTO 49 (predicted 56)   SENSATION: Reports Rt foot numbness  MUSCLE LENGTH: 05/14/22: significant tightness bil hamstrings, piriformis  POSTURE: rounded shoulders, forward head, and decreased lumbar lordosis  PALPATION: 05/14/22: hypomobility noted in lumbar spine, unable to reproduce pain with palpation  LUMBAR ROM:   AROM eval  Flexion Limited 25%  Extension WNL  Right lateral flexion WNL  Left lateral flexion WNL  Right quadrant WNL with reproduction of pain  Left rotation WNL   (Blank rows = not tested)  LOWER EXTREMITY MMT:    MMT Right eval Left eval  Hip flexion 4/5 5/5  Hip  extension    Hip abduction    Hip adduction    Hip internal rotation    Hip external rotation    Knee flexion 5/5 5/5  Knee extension 5/5 5/5  Ankle dorsiflexion 4/5 5/5  Ankle plantarflexion    Ankle inversion    Ankle eversion     (Blank rows = not tested)  LUMBAR SPECIAL TESTS:  05/14/22 Slump test: increase in symptoms on Rt; increase in pain with reduction of neural tension; Lt negaitve   GAIT: 05/14/22: amb independently without significant deviations    TODAY'S TREATMENT:  [facility] Adult PT Treatment:                                                                                                                             05/24/22 ***  05/21/22 Therex NuStep L4 x 5 min Lower trunk rotation 3x30 sec bil Single knee to chest 3x30 sec bil Seated hamstring stretch 3x30 sec bil Supine piriformis stretch 3x30 sec Sidelying hip circles x10 reps CW/CCW bil Slump stretch neural flossing 10 x 5 sec hold each position Prone opposite arm/leg raise with 5 sec hold x10 reps bil Childs pose 3x10 sec hold Rows L4 x10 reps; 5 sec hold   DATE: 05/14/22 Therex See HEP -    PATIENT EDUCATION:  Education details: HEP Person educated: Patient Education method: Consulting civil engineer, Media planner, and Handouts Education comprehension: verbalized understanding, returned demonstration, and needs further education   HOME EXERCISE PROGRAM: Access Code: [email or web address] URL: [email or web address] Date: 05/14/2022 Prepared by: Faustino Congress  Exercises - Supine Lower Trunk Rotation  -  2 x daily - 7 x weekly - 1 sets - 3 reps - 30 sec hold - Hooklying Single Knee to Chest  - 2 x daily - 7 x weekly - 1 sets - 3 reps - 30 sec hold - Supine Piriformis Stretch with Foot on Ground  - 2 x daily - 7 x weekly - 1 sets - 3 reps - 30 sec hold - Seated Hamstring Stretch  - 2 x daily - 7 x weekly - 1 sets - 3 reps - 30 sec hold - Sidelying Hip Circles  - 2 x daily - 7 x weekly - 1 sets -  10 reps  ASSESSMENT:  CLINICAL IMPRESSION: *** Pt reporting no pain after session today so feel exercises are helping at this time.  Will continue to benefit from PT to maximize function.  OBJECTIVE IMPAIRMENTS: decreased mobility, decreased ROM, decreased strength, hypomobility, impaired flexibility, postural dysfunction, and pain.   ACTIVITY LIMITATIONS: carrying, lifting, bending, standing, squatting, and locomotion level  PARTICIPATION LIMITATIONS: meal prep, cleaning, laundry, driving, community activity, and occupation  PERSONAL FACTORS: 3+ comorbidities: OA, hx concussion, OSA, gout, HTN are also affecting patient's functional outcome.   REHAB POTENTIAL: Good  CLINICAL DECISION MAKING: Evolving/moderate complexity  EVALUATION COMPLEXITY: Moderate   GOALS: Goals reviewed with patient? Yes  SHORT TERM GOALS: Target date: 06/04/2022  Independent with initial HEP Goal status: INITIAL  LONG TERM GOALS: Target date: 06/25/2022  Independent with final HEP Goal status: INITIAL  2.  FOTO score improved to 56 Goal status: INITIAL  3.  RLE strength improved to 5/5 for improved function and mobility Goal status: INIITAL  4.  Report pain < 4/10 with standing and walking for improved function Goal status: INITIAL  5.  Report centralization of symptoms for improved tolerance to work activities Goal status: INITIAL   PLAN: PT FREQUENCY: 1-2x/week  PT DURATION: 6 weeks  PLANNED INTERVENTIONS: Therapeutic exercises, Therapeutic activity, Neuromuscular re-education, Gait training, Patient/Family education, Self Care, Joint mobilization, Joint manipulation, Aquatic Therapy, Dry Needling, Electrical stimulation, Spinal manipulation, Spinal mobilization, Cryotherapy, Moist heat, Taping, Traction, Ionotophoresis '4mg'$ /ml Dexamethasone, Manual therapy, and Re-evaluation.  PLAN FOR NEXT SESSION: *** review/update HEP PRN, general flexibility and hip/core strengthening; consider  traction and/or manipulation    Laureen Abrahams, PT, DPT 05/25/22 7:38 AM

## 2022-05-25 NOTE — Therapy (Signed)
OUTPATIENT PHYSICAL THERAPY TREATMENT NOTE   Patient Name: Mitchell Todd MRN: [id number] DOB:[birth year], 66 y.o., male Today's Date: 05/28/2022  END OF SESSION:   PT End of Session - 05/28/22 0849     Visit Number 3    Number of Visits 12    Date for PT Re-Evaluation 06/25/22    Authorization Type [employer], $15 copay    PT Start Time [phone]    PT Stop Time 0925    PT Time Calculation (min) 38 min    Activity Tolerance Patient tolerated treatment well    Behavior During Therapy [facility] for tasks assessed/performed              Past Medical History:  Diagnosis Date   Arthritis    knees oa   Concussion 04/2018   no residual from   Eczema    Elevated cholesterol    GERD (gastroesophageal reflux disease)    Gout    Hypertension    Sleep apnea    ues cpap set on 7  uses 2 oe 3 night s per week   Urinary frequency    Wears glasses    Past Surgical History:  Procedure Laterality Date   APPENDECTOMY  as child   foot cyst removed Right 2012   TONSILLECTOMY  as child   TRANSURETHRAL RESECTION OF PROSTATE N/A 11/21/2020   Procedure: TRANSURETHRAL RESECTION OF THE PROSTATE (TURP)/ BIPOLAR;  Surgeon: Janith Lima, MD;  Location: [facility];  Service: Urology;  Laterality: N/A;   trigger finger surgeyr  left thumb 2012   Patient Active Problem List   Diagnosis Date Noted   BPH (benign prostatic hyperplasia) 11/21/2020   CAD 11/14/2007   ESOPHAGEAL STRICTURE 11/14/2007   GERD 11/14/2007     THERAPY DIAG:  Other low back pain  Muscle weakness (generalized)  Other symptoms and signs involving the musculoskeletal system  Pain in right hip  PCP: Maury Dus, MD  REFERRING PROVIDER: Newt Minion, MD   REFERRING DIAG: [id number] (ICD-10-CM) - Pain in right hip M43.17 (ICD-10-CM) - Spondylolisthesis, lumbosacral region   Rationale for Evaluation and Treatment Rehabilitation  EVAL THERAPY DIAG:  Other low back pain - Plan: PT plan of care  cert/re-cert  Muscle weakness (generalized) - Plan: PT plan of care cert/re-cert  Other symptoms and signs involving the musculoskeletal system - Plan: PT plan of care cert/re-cert  Pain in right hip - Plan: PT plan of care cert/re-cert  ONSET DATE: chronic x 1 year, exacerbation x 2 months  SUBJECTIVE:  SUBJECTIVE STATEMENT: Was a little under the weather the other day.  No pain upon getting here today.  Pain was pretty high yesterday after working all day.  PERTINENT HISTORY:  OA, hx concussion, OSA, gout, HTN  PAIN:  Are you having pain? Yes: NPRS scale: 0 currently, up to 8, at best 1/10 Pain location: Rt hip, Rt ant shin and foot (numbness) Pain description: sharp Aggravating factors: walking (various times) Relieving factors: sitting/rest, advil (settles pretty quickly)   PRECAUTIONS: None  WEIGHT BEARING RESTRICTIONS: No  FALLS:  Has patient fallen in last 6 months? 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 2 view radiographs of the lumbar spine shows a pars defect with a grade 1 spondylolisthesis at L5-S1. There is mild calcification of the aorta and disc space narrowing.   PATIENT SURVEYS:  05/14/22: FOTO 49 (predicted 56)   SENSATION: Reports Rt foot numbness  MUSCLE LENGTH: 05/14/22:  significant tightness bil hamstrings, piriformis  POSTURE: rounded shoulders, forward head, and decreased lumbar lordosis  PALPATION: 05/14/22: hypomobility noted in lumbar spine, unable to reproduce pain with palpation  LUMBAR ROM:   AROM eval  Flexion Limited 25%  Extension WNL  Right lateral flexion WNL  Left lateral flexion WNL  Right quadrant WNL with reproduction of pain  Left rotation WNL   (Blank rows = not tested)  LOWER EXTREMITY MMT:    MMT Right eval Left eval  Hip flexion 4/5 5/5  Hip extension    Hip abduction    Hip adduction    Hip internal rotation    Hip external rotation    Knee flexion 5/5 5/5  Knee extension 5/5 5/5  Ankle dorsiflexion 4/5 5/5  Ankle plantarflexion    Ankle inversion    Ankle eversion     (Blank rows = not tested)  LUMBAR SPECIAL TESTS:  05/14/22 Slump test: increase in symptoms on Rt; increase in pain with reduction of neural tension; Lt negaitve   GAIT: 05/14/22: amb independently without significant deviations    TODAY'S TREATMENT:  [facility] Adult PT Treatment:                                                                                                                             05/28/22 Therex NuStep L4 x 5 min Rows on BATCA 35# 3x10 Lat pull downs on BATCA 35# 3x10 Prone hip extension alternating x 10 reps bil; 3 sec hold Single knee to chest 3x20 sec bil Lower trunk rotation 4x20 sec bil Slump  stretch neural flossing 5 x 5 sec hold each position Standing small range lumbar extension 10 x 10 sec hold   05/21/22 Therex NuStep L4 x 5 min Lower trunk rotation 3x30 sec bil Single knee to chest 3x30 sec bil Seated hamstring stretch 3x30 sec bil Supine piriformis stretch 3x30 sec Sidelying hip circles x10 reps CW/CCW bil Slump stretch neural flossing 10 x 5 sec hold each position Prone opposite arm/leg raise with 5 sec hold x10 reps bil Childs pose 3x10 sec hold Rows L4 x10 reps; 5 sec hold   DATE:  05/14/22 Therex See HEP -    PATIENT EDUCATION:  Education details: HEP Person educated: Patient Education method: Consulting civil engineer, Media planner, and Handouts Education comprehension: verbalized understanding, returned demonstration, and needs further education   HOME EXERCISE PROGRAM: Access Code: [email or web address] URL: [email or web address] Date: 05/14/2022 Prepared by: Faustino Congress  Exercises - Supine Lower Trunk Rotation  - 2 x daily - 7 x weekly - 1 sets - 3 reps - 30 sec hold - Hooklying Single Knee to Chest  - 2 x daily - 7 x weekly - 1 sets - 3 reps - 30 sec hold - Supine Piriformis Stretch with Foot on Ground  - 2 x daily - 7 x weekly - 1 sets - 3 reps - 30 sec hold - Seated Hamstring Stretch  - 2 x daily - 7 x weekly - 1 sets - 3 reps - 30 sec hold - Sidelying Hip Circles  - 2 x daily - 7 x weekly - 1 sets - 10 reps  ASSESSMENT:  CLINICAL IMPRESSION: Pt continues to have elevated pain with increased activity.  Trial of extension based exercises to see if that helps with pain.  Will continue to benefit from PT to maximize function.  OBJECTIVE IMPAIRMENTS: decreased mobility, decreased ROM, decreased strength, hypomobility, impaired flexibility, postural dysfunction, and pain.   ACTIVITY LIMITATIONS: carrying, lifting, bending, standing, squatting, and locomotion level  PARTICIPATION LIMITATIONS: meal prep, cleaning, laundry, driving, community activity, and occupation  PERSONAL FACTORS: 3+ comorbidities: OA, hx concussion, OSA, gout, HTN are also affecting patient's functional outcome.   REHAB POTENTIAL: Good  CLINICAL DECISION MAKING: Evolving/moderate complexity  EVALUATION COMPLEXITY: Moderate   GOALS: Goals reviewed with patient? Yes  SHORT TERM GOALS: Target date: 06/04/2022  Independent with initial HEP Goal status: INITIAL  LONG TERM GOALS: Target date: 06/25/2022  Independent with final HEP Goal status: INITIAL  2.  FOTO score improved  to 56 Goal status: INITIAL  3.  RLE strength improved to 5/5 for improved function and mobility Goal status: INIITAL  4.  Report pain < 4/10 with standing and walking for improved function Goal status: INITIAL  5.  Report centralization of symptoms for improved tolerance to work activities Goal status: INITIAL   PLAN: PT FREQUENCY: 1-2x/week  PT DURATION: 6 weeks  PLANNED INTERVENTIONS: Therapeutic exercises, Therapeutic activity, Neuromuscular re-education, Gait training, Patient/Family education, Self Care, Joint mobilization, Joint manipulation, Aquatic Therapy, Dry Needling, Electrical stimulation, Spinal manipulation, Spinal mobilization, Cryotherapy, Moist heat, Taping, Traction, Ionotophoresis '4mg'$ /ml Dexamethasone, Manual therapy, and Re-evaluation.  PLAN FOR NEXT SESSION: trial traction?, review/update HEP PRN, general flexibility and hip/core strengthening; consider manipulation    Laureen Abrahams, PT, DPT 05/28/22 9:32 AM

## 2022-05-28 ENCOUNTER — Ambulatory Visit: Payer: No Typology Code available for payment source | Admitting: Physical Therapy

## 2022-05-28 ENCOUNTER — Encounter: Payer: Self-pay | Admitting: Physical Therapy

## 2022-05-28 DIAGNOSIS — M5459 Other low back pain: Secondary | ICD-10-CM

## 2022-05-28 DIAGNOSIS — M6281 Muscle weakness (generalized): Secondary | ICD-10-CM

## 2022-05-28 DIAGNOSIS — R29898 Other symptoms and signs involving the musculoskeletal system: Secondary | ICD-10-CM

## 2022-05-28 DIAGNOSIS — M25551 Pain in right hip: Secondary | ICD-10-CM | POA: Diagnosis not present

## 2022-05-30 ENCOUNTER — Encounter: Payer: No Typology Code available for payment source | Admitting: Physical Therapy

## 2022-06-04 ENCOUNTER — Encounter: Payer: Self-pay | Admitting: Physical Therapy

## 2022-06-04 ENCOUNTER — Ambulatory Visit: Payer: No Typology Code available for payment source | Admitting: Physical Therapy

## 2022-06-04 DIAGNOSIS — M25551 Pain in right hip: Secondary | ICD-10-CM | POA: Diagnosis not present

## 2022-06-04 DIAGNOSIS — M6281 Muscle weakness (generalized): Secondary | ICD-10-CM | POA: Diagnosis not present

## 2022-06-04 DIAGNOSIS — R29898 Other symptoms and signs involving the musculoskeletal system: Secondary | ICD-10-CM | POA: Diagnosis not present

## 2022-06-04 DIAGNOSIS — M5459 Other low back pain: Secondary | ICD-10-CM | POA: Diagnosis not present

## 2022-06-04 NOTE — Therapy (Signed)
OUTPATIENT PHYSICAL THERAPY TREATMENT NOTE   Patient Name: Mitchell Todd MRN: [id number] DOB:[birth year], 66 y.o., male Today's Date: 06/04/2022  END OF SESSION:   PT End of Session - 06/04/22 0855     Visit Number 4    Number of Visits 12    Date for PT Re-Evaluation 06/25/22    Authorization Type [employer], $15 copay    PT Start Time [phone]    PT Stop Time 0927    PT Time Calculation (min) 34 min    Activity Tolerance Patient tolerated treatment well    Behavior During Therapy [facility] for tasks assessed/performed               Past Medical History:  Diagnosis Date   Arthritis    knees oa   Concussion 04/2018   no residual from   Eczema    Elevated cholesterol    GERD (gastroesophageal reflux disease)    Gout    Hypertension    Sleep apnea    ues cpap set on 7  uses 2 oe 3 night s per week   Urinary frequency    Wears glasses    Past Surgical History:  Procedure Laterality Date   APPENDECTOMY  as child   foot cyst removed Right 2012   TONSILLECTOMY  as child   TRANSURETHRAL RESECTION OF PROSTATE N/A 11/21/2020   Procedure: TRANSURETHRAL RESECTION OF THE PROSTATE (TURP)/ BIPOLAR;  Surgeon: Janith Lima, MD;  Location: [facility];  Service: Urology;  Laterality: N/A;   trigger finger surgeyr  left thumb 2012   Patient Active Problem List   Diagnosis Date Noted   BPH (benign prostatic hyperplasia) 11/21/2020   CAD 11/14/2007   ESOPHAGEAL STRICTURE 11/14/2007   GERD 11/14/2007     THERAPY DIAG:  Other low back pain  Muscle weakness (generalized)  Other symptoms and signs involving the musculoskeletal system  Pain in right hip  PCP: Maury Dus, MD  REFERRING PROVIDER: Newt Minion, MD   REFERRING DIAG: [phone] (ICD-10-CM) - Pain in right hip M43.17 (ICD-10-CM) - Spondylolisthesis, lumbosacral region   Rationale for Evaluation and Treatment Rehabilitation  EVAL THERAPY DIAG:  Other low back pain - Plan: PT plan of care  cert/re-cert  Muscle weakness (generalized) - Plan: PT plan of care cert/re-cert  Other symptoms and signs involving the musculoskeletal system - Plan: PT plan of care cert/re-cert  Pain in right hip - Plan: PT plan of care cert/re-cert  ONSET DATE: chronic x 1 year, exacerbation x 2 months  SUBJECTIVE:  SUBJECTIVE STATEMENT: Arrived late -was working in the barn and lost track of time, no significant change in symptoms   PERTINENT HISTORY:  OA, hx concussion, OSA, gout, HTN  PAIN:  Are you having pain? Yes: NPRS scale: 0 currently, up to 8, at best 1/10 Pain location: Rt hip, Rt ant shin and foot (numbness) Pain description: sharp Aggravating factors: walking (various times) Relieving factors: sitting/rest, advil (settles pretty quickly)   PRECAUTIONS: None  WEIGHT BEARING RESTRICTIONS: No  FALLS:  Has patient fallen in last 6 months? 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 2 view radiographs of the lumbar spine shows a pars defect with a grade 1 spondylolisthesis at L5-S1. There is mild calcification of the aorta and disc space narrowing.   PATIENT SURVEYS:  05/14/22: FOTO 49 (predicted 56)   SENSATION: Reports Rt foot numbness  MUSCLE LENGTH: 05/14/22: significant tightness bil hamstrings,  piriformis  POSTURE: rounded shoulders, forward head, and decreased lumbar lordosis  PALPATION: 05/14/22: hypomobility noted in lumbar spine, unable to reproduce pain with palpation  LUMBAR ROM:   AROM eval  Flexion Limited 25%  Extension WNL  Right lateral flexion WNL  Left lateral flexion WNL  Right quadrant WNL with reproduction of pain  Left rotation WNL   (Blank rows = not tested)  LOWER EXTREMITY MMT:    MMT Right eval Left eval  Hip flexion 4/5 5/5  Hip extension    Hip abduction    Hip adduction    Hip internal rotation    Hip external rotation    Knee flexion 5/5 5/5  Knee extension 5/5 5/5  Ankle dorsiflexion 4/5 5/5  Ankle plantarflexion    Ankle inversion    Ankle eversion     (Blank rows = not tested)  LUMBAR SPECIAL TESTS:  05/14/22 Slump test: increase in symptoms on Rt; increase in pain with reduction of neural tension; Lt negaitve   GAIT: 05/14/22: amb independently without significant deviations    TODAY'S TREATMENT:  [facility] Adult PT Treatment:                                                                                                                             06/04/22 Therex NuStep L4 x 5 min Single knee to chest 2x20 sec bil Supine piriformis stretch 2x20 sec Discussed current HEP - pt independent without questions/concerns  Modalities Mechanical lumbar traction x 10 min: max pull 85# (60 sec); min pull 75# (20 sec)  05/28/22 Therex NuStep L4 x 5  min Rows on BATCA 35# 3x10 Lat pull downs on BATCA 35# 3x10 Prone hip extension alternating x 10 reps bil; 3 sec hold Single knee to chest 3x20 sec bil Lower trunk rotation 4x20 sec bil Slump stretch neural flossing 5 x 5 sec hold each position Standing small range lumbar extension 10 x 10 sec hold   05/21/22 Therex NuStep L4 x 5 min Lower trunk rotation 3x30 sec bil Single knee to chest 3x30 sec bil Seated hamstring stretch 3x30 sec bil Supine piriformis stretch 3x30  sec Sidelying hip circles x10 reps CW/CCW bil Slump stretch neural flossing 10 x 5 sec hold each position Prone opposite arm/leg raise with 5 sec hold x10 reps bil Childs pose 3x10 sec hold Rows L4 x10 reps; 5 sec hold   DATE: 05/14/22 Therex See HEP -    PATIENT EDUCATION:  Education details: HEP Person educated: Patient Education method: Consulting civil engineer, Media planner, and Handouts Education comprehension: verbalized understanding, returned demonstration, and needs further education   HOME EXERCISE PROGRAM: Access Code: [email or web address] URL: [email or web address] Date: 05/14/2022 Prepared by: Faustino Congress  Exercises - Supine Lower Trunk Rotation  - 2 x daily - 7 x weekly - 1 sets - 3 reps - 30 sec hold - Hooklying Single Knee to Chest  - 2 x daily - 7 x weekly - 1 sets - 3 reps - 30 sec hold - Supine Piriformis Stretch with Foot on Ground  - 2 x daily - 7 x weekly - 1 sets - 3 reps - 30 sec hold - Seated Hamstring Stretch  - 2 x daily - 7 x weekly - 1 sets - 3 reps - 30 sec hold - Sidelying Hip Circles  - 2 x daily - 7 x weekly - 1 sets - 10 reps  ASSESSMENT:  CLINICAL IMPRESSION: Trial of traction today to see if this is helpful.  Overall no pain during sessions but still up to 8/10 with increased walking and activity.  Will continue to benefit from PT to maximize function.  OBJECTIVE IMPAIRMENTS: decreased mobility, decreased ROM, decreased strength, hypomobility, impaired flexibility, postural dysfunction, and pain.   ACTIVITY LIMITATIONS: carrying, lifting, bending, standing, squatting, and locomotion level  PARTICIPATION LIMITATIONS: meal prep, cleaning, laundry, driving, community activity, and occupation  PERSONAL FACTORS: 3+ comorbidities: OA, hx concussion, OSA, gout, HTN are also affecting patient's functional outcome.   REHAB POTENTIAL: Good  CLINICAL DECISION MAKING: Evolving/moderate complexity  EVALUATION COMPLEXITY:  Moderate   GOALS: Goals reviewed with patient? Yes  SHORT TERM GOALS: Target date: 06/04/2022  Independent with initial HEP Goal status: MET 06/04/22  LONG TERM GOALS: Target date: 06/25/2022  Independent with final HEP Goal status: INITIAL  2.  FOTO score improved to 56 Goal status: INITIAL  3.  RLE strength improved to 5/5 for improved function and mobility Goal status: INIITAL  4.  Report pain < 4/10 with standing and walking for improved function Goal status: INITIAL  5.  Report centralization of symptoms for improved tolerance to work activities Goal status: INITIAL   PLAN: PT FREQUENCY: 1-2x/week  PT DURATION: 6 weeks  PLANNED INTERVENTIONS: Therapeutic exercises, Therapeutic activity, Neuromuscular re-education, Gait training, Patient/Family education, Self Care, Joint mobilization, Joint manipulation, Aquatic Therapy, Dry Needling, Electrical stimulation, Spinal manipulation, Spinal mobilization, Cryotherapy, Moist heat, Taping, Traction, Ionotophoresis 69m/ml Dexamethasone, Manual therapy, and Re-evaluation.  PLAN FOR NEXT SESSION: assess traction, review/update HEP PRN, general flexibility and hip/core strengthening; consider manipulation    SLaureen Abrahams PT, DPT 06/04/22 9:26 AM

## 2022-06-06 ENCOUNTER — Encounter: Payer: Self-pay | Admitting: Physical Therapy

## 2022-06-06 ENCOUNTER — Ambulatory Visit: Payer: No Typology Code available for payment source | Admitting: Physical Therapy

## 2022-06-06 DIAGNOSIS — M6281 Muscle weakness (generalized): Secondary | ICD-10-CM | POA: Diagnosis not present

## 2022-06-06 DIAGNOSIS — M5459 Other low back pain: Secondary | ICD-10-CM | POA: Diagnosis not present

## 2022-06-06 DIAGNOSIS — M25551 Pain in right hip: Secondary | ICD-10-CM | POA: Diagnosis not present

## 2022-06-06 DIAGNOSIS — R29898 Other symptoms and signs involving the musculoskeletal system: Secondary | ICD-10-CM | POA: Diagnosis not present

## 2022-06-06 NOTE — Therapy (Addendum)
OUTPATIENT PHYSICAL THERAPY TREATMENT NOTE DISCHARGE SUMMARY   Patient Name: Mitchell Todd MRN: [id number] DOB:1955-11-21, 66 y.o., male Today's Date: 06/06/2022  END OF SESSION:   PT End of Session - 06/06/22 0847     Visit Number 5    Number of Visits 12    Date for PT Re-Evaluation 06/25/22    Authorization Type [employer], $15 copay    PT Start Time 0845    PT Stop Time 0923    PT Time Calculation (min) 38 min    Activity Tolerance Patient tolerated treatment well    Behavior During Therapy [facility] for tasks assessed/performed                Past Medical History:  Diagnosis Date   Arthritis    knees oa   Concussion 04/2018   no residual from   Eczema    Elevated cholesterol    GERD (gastroesophageal reflux disease)    Gout    Hypertension    Sleep apnea    ues cpap set on 7  uses 2 oe 3 night s per week   Urinary frequency    Wears glasses    Past Surgical History:  Procedure Laterality Date   APPENDECTOMY  as child   foot cyst removed Right 2012   TONSILLECTOMY  as child   TRANSURETHRAL RESECTION OF PROSTATE N/A 11/21/2020   Procedure: TRANSURETHRAL RESECTION OF THE PROSTATE (TURP)/ BIPOLAR;  Surgeon: Janith Lima, MD;  Location: [facility];  Service: Urology;  Laterality: N/A;   trigger finger surgeyr  left thumb 2012   Patient Active Problem List   Diagnosis Date Noted   BPH (benign prostatic hyperplasia) 11/21/2020   CAD 11/14/2007   ESOPHAGEAL STRICTURE 11/14/2007   GERD 11/14/2007     THERAPY DIAG:  Other low back pain  Muscle weakness (generalized)  Other symptoms and signs involving the musculoskeletal system  Pain in right hip  PCP: Maury Dus, MD  REFERRING PROVIDER: Newt Minion, MD   REFERRING DIAG: [phone] (ICD-10-CM) - Pain in right hip M43.17 (ICD-10-CM) - Spondylolisthesis, lumbosacral region   Rationale for Evaluation and Treatment Rehabilitation  EVAL THERAPY DIAG:  Other low back pain -  Plan: PT plan of care cert/re-cert  Muscle weakness (generalized) - Plan: PT plan of care cert/re-cert  Other symptoms and signs involving the musculoskeletal system - Plan: PT plan of care cert/re-cert  Pain in right hip - Plan: PT plan of care cert/re-cert  ONSET DATE: chronic x 1 year, exacerbation x 2 months  SUBJECTIVE:  SUBJECTIVE STATEMENT: Felt better after last session, thinks traction was helpful   PERTINENT HISTORY:  OA, hx concussion, OSA, gout, HTN  PAIN:  Are you having pain? Yes: NPRS scale: 4 currently, up to 8, at best 1/10 Pain location: Rt hip, Rt ant shin and foot (numbness) Pain description: sharp Aggravating factors: walking (various times) Relieving factors: sitting/rest, advil (settles pretty quickly)   PRECAUTIONS: None  WEIGHT BEARING RESTRICTIONS: No  FALLS:  Has patient fallen in last 6 months? 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 2 view radiographs of the lumbar spine shows a pars defect with a grade 1 spondylolisthesis at L5-S1. There is mild calcification of the aorta and disc space narrowing.   PATIENT SURVEYS:  05/14/22: FOTO 49 (predicted 56)   SENSATION: Reports Rt foot numbness  MUSCLE LENGTH: 05/14/22: significant tightness bil hamstrings,  piriformis  POSTURE: rounded shoulders, forward head, and decreased lumbar lordosis  PALPATION: 05/14/22: hypomobility noted in lumbar spine, unable to reproduce pain with palpation  LUMBAR ROM:   AROM eval  Flexion Limited 25%  Extension WNL  Right lateral flexion WNL  Left lateral flexion WNL  Right quadrant WNL with reproduction of pain  Left rotation WNL   (Blank rows = not tested)  LOWER EXTREMITY MMT:    MMT Right eval Left eval  Hip flexion 4/5 5/5  Hip extension    Hip abduction    Hip adduction    Hip internal rotation    Hip external rotation    Knee flexion 5/5 5/5  Knee extension 5/5 5/5  Ankle dorsiflexion 4/5 5/5  Ankle plantarflexion    Ankle inversion    Ankle eversion     (Blank rows = not tested)  LUMBAR SPECIAL TESTS:  05/14/22 Slump test: increase in symptoms on Rt; increase in pain with reduction of neural tension; Lt negaitve   GAIT: 05/14/22: amb independently without significant deviations    TODAY'S TREATMENT:  [facility] Adult PT Treatment: 06/06/22 Therex NuStep L4 x 8 min Single knee to chest 2x20 sec bil Supine piriformis stretch 2x20 sec  Modalities Mechanical lumbar traction x 10 min: max pull 100# (60 sec); min pull 90# (20 sec)   06/04/22 Therex NuStep L4 x 5 min Single knee to chest 2x20 sec bil Supine piriformis stretch 2x20 sec Discussed current HEP - pt independent without questions/concerns  Modalities Mechanical lumbar traction x 10 min: max pull 85# (60 sec); min pull 75# (20 sec)  05/28/22 Therex NuStep L4 x 5 min Rows on BATCA 35# 3x10 Lat pull downs on BATCA 35# 3x10 Prone hip extension alternating x 10 reps bil; 3 sec hold Single knee to chest 3x20 sec bil Lower trunk rotation 4x20 sec bil Slump stretch neural flossing 5 x 5 sec hold each position Standing small range lumbar extension 10 x 10 sec hold   05/21/22 Therex NuStep L4 x 5 min Lower trunk rotation 3x30 sec bil Single knee to chest 3x30  sec bil Seated hamstring stretch 3x30 sec bil Supine piriformis stretch 3x30 sec Sidelying  hip circles x10 reps CW/CCW bil Slump stretch neural flossing 10 x 5 sec hold each position Prone opposite arm/leg raise with 5 sec hold x10 reps bil Childs pose 3x10 sec hold Rows L4 x10 reps; 5 sec hold   DATE: 05/14/22 Therex See HEP -    PATIENT EDUCATION:  Education details: HEP Person educated: Patient Education method: Consulting civil engineer, Demonstration, and Handouts Education comprehension: verbalized understanding, returned demonstration, and needs further education   HOME EXERCISE PROGRAM: Access Code: [email or web address] URL: [email or web address] Date: 05/14/2022 Prepared by: Faustino Congress  Exercises - Supine Lower Trunk Rotation  - 2 x daily - 7 x weekly - 1 sets - 3 reps - 30 sec hold - Hooklying Single Knee to Chest  - 2 x daily - 7 x weekly - 1 sets - 3 reps - 30 sec hold - Supine Piriformis Stretch with Foot on Ground  - 2 x daily - 7 x weekly - 1 sets - 3 reps - 30 sec hold - Seated Hamstring Stretch  - 2 x daily - 7 x weekly - 1 sets - 3 reps - 30 sec hold - Sidelying Hip Circles  - 2 x daily - 7 x weekly - 1 sets - 10 reps  ASSESSMENT:  CLINICAL IMPRESSION: Repeat of prior session as pt had positive response, but did increase tension for traction at pt's request.  Pain in shin resolved with traction at this time.  Will continue to benefit from PT to maximize function.   OBJECTIVE IMPAIRMENTS: decreased mobility, decreased ROM, decreased strength, hypomobility, impaired flexibility, postural dysfunction, and pain.   ACTIVITY LIMITATIONS: carrying, lifting, bending, standing, squatting, and locomotion level  PARTICIPATION LIMITATIONS: meal prep, cleaning, laundry, driving, community activity, and occupation  PERSONAL FACTORS: 3+ comorbidities: OA, hx concussion, OSA, gout, HTN are also affecting patient's functional outcome.   REHAB POTENTIAL: Good  CLINICAL  DECISION MAKING: Evolving/moderate complexity  EVALUATION COMPLEXITY: Moderate   GOALS: Goals reviewed with patient? Yes  SHORT TERM GOALS: Target date: 06/04/2022  Independent with initial HEP Goal status: MET 06/04/22  LONG TERM GOALS: Target date: 06/25/2022  Independent with final HEP Goal status: INITIAL  2.  FOTO score improved to 56 Goal status: INITIAL  3.  RLE strength improved to 5/5 for improved function and mobility Goal status: INIITAL  4.  Report pain < 4/10 with standing and walking for improved function Goal status: INITIAL  5.  Report centralization of symptoms for improved tolerance to work activities Goal status: INITIAL   PLAN: PT FREQUENCY: 1-2x/week  PT DURATION: 6 weeks  PLANNED INTERVENTIONS: Therapeutic exercises, Therapeutic activity, Neuromuscular re-education, Gait training, Patient/Family education, Self Care, Joint mobilization, Joint manipulation, Aquatic Therapy, Dry Needling, Electrical stimulation, Spinal manipulation, Spinal mobilization, Cryotherapy, Moist heat, Taping, Traction, Ionotophoresis 50m/ml Dexamethasone, Manual therapy, and Re-evaluation.  PLAN FOR NEXT SESSION: assess traction, review/update HEP PRN, general flexibility and hip/core strengthening, to MD after next session    SLaureen Abrahams PT, DPT 06/06/22 9:26 AM       PHYSICAL THERAPY DISCHARGE SUMMARY  Visits from Start of Care: 5  Current functional level related to goals / functional outcomes: See above   Remaining deficits: See above   Education / Equipment: HEP   Patient agrees to discharge. Patient goals were partially met. Patient is being discharged due to not returning since the last visit.  SLaureen Abrahams PT, DPT 07/09/22 10:49 AM  [facility] [address] [address]Phone: [phone]  Fax:  [phone]

## 2022-06-11 ENCOUNTER — Encounter: Payer: No Typology Code available for payment source | Admitting: Physical Therapy

## 2022-06-11 ENCOUNTER — Encounter: Payer: Self-pay | Admitting: Orthopedic Surgery

## 2022-06-11 ENCOUNTER — Ambulatory Visit (INDEPENDENT_AMBULATORY_CARE_PROVIDER_SITE_OTHER): Payer: No Typology Code available for payment source | Admitting: Orthopedic Surgery

## 2022-06-11 DIAGNOSIS — M4317 Spondylolisthesis, lumbosacral region: Secondary | ICD-10-CM | POA: Diagnosis not present

## 2022-06-11 NOTE — Progress Notes (Signed)
Office Visit Note   Patient: Mitchell Todd           Date of Birth: 12-17-1955           MRN: [id number] Visit Date: 06/11/2022              Requested by: Maury Dus, MD [address] [place],  [place] [id number] PCP: Maury Dus, MD  Chief Complaint  Patient presents with   Right Leg - Follow-up   Lower Back - Follow-up      HPI: Patient is a 66 year old gentleman who presents in follow-up for grade 1 spondylolisthesis at L5-S1.  Patient has undergone physical therapy he is used a prednisone Dosepak without relief.  Patient states he has pain radiating from the right buttocks down to the dorsum of the right foot.  Assessment & Plan: Visit Diagnoses:  1. Spondylolisthesis, lumbosacral region     Plan: We will request an MRI scan for the lumbar spine discussed that he may benefit from an epidural steroid injection with Dr. Ernestina Patches.  Follow-Up Instructions: Return in about 2 weeks (around 06/25/2022).   Ortho Exam  Patient is alert, oriented, no adenopathy, well-dressed, normal affect, normal respiratory effort. Examination patient has radiating pain from the right buttocks down to the dorsum of the right foot.  Patient has a positive straight leg raise on the right.  There is no focal motor weakness.  Imaging: No results found. No images are attached to the encounter.  Labs: No results found for: "HGBA1C", "ESRSEDRATE", "CRP", "LABURIC", "REPTSTATUS", "GRAMSTAIN", "CULT", "LABORGA"   No results found for: "ALBUMIN", "PREALBUMIN", "CBC"  No results found for: "MG" No results found for: "VD25OH"  No results found for: "PREALBUMIN"    Latest Ref Rng & Units 11/22/2020    2:44 AM 11/21/2020    9:29 AM  CBC EXTENDED  WBC 4.0 - 10.5 K/uL 9.2    RBC 4.22 - 5.81 MIL/uL 4.32    Hemoglobin 13.0 - 17.0 g/dL 13.6  15.0   HCT 39.0 - 52.0 % 40.3  44.0   Platelets 150 - 400 K/uL 188       There is no height or weight on file to calculate BMI.  Orders:   No orders of the defined types were placed in this encounter.  No orders of the defined types were placed in this encounter.    Procedures: No procedures performed  Clinical Data: No additional findings.  ROS:  All other systems negative, except as noted in the HPI. Review of Systems  Objective: Vital Signs: There were no vitals taken for this visit.  Specialty Comments:  No specialty comments available.  PMFS History: Patient Active Problem List   Diagnosis Date Noted   BPH (benign prostatic hyperplasia) 11/21/2020   CAD 11/14/2007   ESOPHAGEAL STRICTURE 11/14/2007   GERD 11/14/2007   Past Medical History:  Diagnosis Date   Arthritis    knees oa   Concussion 04/2018   no residual from   Eczema    Elevated cholesterol    GERD (gastroesophageal reflux disease)    Gout    Hypertension    Sleep apnea    ues cpap set on 7  uses 2 oe 3 night s per week   Urinary frequency    Wears glasses     History reviewed. No pertinent family history.  Past Surgical History:  Procedure Laterality Date   APPENDECTOMY  as child   foot cyst removed Right 2012  TONSILLECTOMY  as child   TRANSURETHRAL RESECTION OF PROSTATE N/A 11/21/2020   Procedure: TRANSURETHRAL RESECTION OF THE PROSTATE (TURP)/ BIPOLAR;  Surgeon: Janith Lima, MD;  Location: [facility];  Service: Urology;  Laterality: N/A;   trigger finger surgeyr  left thumb 2012   Social History   Occupational History   Not on file  Tobacco Use   Smoking status: Former   Smokeless tobacco: Never  Substance and Sexual Activity   Alcohol use: Not on file   Drug use: Not on file   Sexual activity: Not on file

## 2022-06-11 NOTE — Therapy (Incomplete)
OUTPATIENT PHYSICAL THERAPY TREATMENT NOTE   Patient Name: Mitchell Todd MRN: [id number] DOB:02-Jan-1956, 66 y.o., male Today's Date: 06/11/2022  END OF SESSION:        Past Medical History:  Diagnosis Date   Arthritis    knees oa   Concussion 04/2018   no residual from   Eczema    Elevated cholesterol    GERD (gastroesophageal reflux disease)    Gout    Hypertension    Sleep apnea    ues cpap set on 7  uses 2 oe 3 night s per week   Urinary frequency    Wears glasses    Past Surgical History:  Procedure Laterality Date   APPENDECTOMY  as child   foot cyst removed Right 2012   TONSILLECTOMY  as child   TRANSURETHRAL RESECTION OF PROSTATE N/A 11/21/2020   Procedure: TRANSURETHRAL RESECTION OF THE PROSTATE (TURP)/ BIPOLAR;  Surgeon: Janith Lima, MD;  Location: [facility];  Service: Urology;  Laterality: N/A;   trigger finger surgeyr  left thumb 2012   Patient Active Problem List   Diagnosis Date Noted   BPH (benign prostatic hyperplasia) 11/21/2020   CAD 11/14/2007   ESOPHAGEAL STRICTURE 11/14/2007   GERD 11/14/2007     THERAPY DIAG:  No diagnosis found.  PCP: Maury Dus, MD  REFERRING PROVIDER: Newt Minion, MD   REFERRING DIAG: [phone] (ICD-10-CM) - Pain in right hip M43.17 (ICD-10-CM) - Spondylolisthesis, lumbosacral region   Rationale for Evaluation and Treatment Rehabilitation  EVAL THERAPY DIAG:  Other low back pain - Plan: PT plan of care cert/re-cert  Muscle weakness (generalized) - Plan: PT plan of care cert/re-cert  Other symptoms and signs involving the musculoskeletal system - Plan: PT plan of care cert/re-cert  Pain in right hip - Plan: PT plan of care cert/re-cert  ONSET DATE: chronic x 1 year, exacerbation x 2 months  SUBJECTIVE:                                                                                                                                                                                            SUBJECTIVE STATEMENT: *** Felt better after last session, thinks traction was helpful   PERTINENT HISTORY:  OA, hx concussion, OSA, gout, HTN  PAIN:  Are you having pain? Yes: NPRS scale: 4 currently, up to 8, at best 1/10 Pain location: Rt hip, Rt ant shin and foot (numbness) Pain description: sharp Aggravating factors: walking (various times) Relieving factors: sitting/rest, advil (settles pretty quickly)   PRECAUTIONS: None  WEIGHT BEARING RESTRICTIONS: No  FALLS:  Has patient fallen in last 6  months? 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 2 view radiographs of the lumbar spine shows a pars defect with a grade 1 spondylolisthesis at L5-S1. There is mild calcification of the aorta and disc space narrowing.   PATIENT SURVEYS:  05/14/22: FOTO 49 (predicted 56)   SENSATION: Reports Rt foot numbness  MUSCLE LENGTH: 05/14/22: significant tightness bil hamstrings, piriformis  POSTURE: rounded shoulders, forward head, and decreased lumbar lordosis  PALPATION: 05/14/22: hypomobility noted in lumbar spine, unable to reproduce pain with palpation  LUMBAR ROM:   AROM eval  Flexion Limited 25%  Extension WNL  Right lateral flexion WNL  Left lateral flexion WNL  Right quadrant WNL with reproduction of pain  Left rotation WNL   (Blank rows = not tested)  LOWER EXTREMITY MMT:    MMT Right eval Left eval  Hip flexion 4/5 5/5  Hip extension    Hip abduction    Hip  adduction    Hip internal rotation    Hip external rotation    Knee flexion 5/5 5/5  Knee extension 5/5 5/5  Ankle dorsiflexion 4/5 5/5  Ankle plantarflexion    Ankle inversion    Ankle eversion     (Blank rows = not tested)  LUMBAR SPECIAL TESTS:  05/14/22 Slump test: increase in symptoms on Rt; increase in pain with reduction of neural tension; Lt negaitve   GAIT: 05/14/22: amb independently without significant deviations    TODAY'S TREATMENT:  [facility] Adult PT Treatment: 06/11/22 ***  06/06/22 Therex NuStep L4 x 8 min Single knee to chest 2x20 sec bil Supine piriformis stretch 2x20 sec  Modalities Mechanical lumbar traction x 10 min: max pull 100# (60 sec); min pull 90# (20 sec)   06/04/22 Therex NuStep L4 x 5 min Single knee to chest 2x20 sec bil Supine piriformis stretch 2x20 sec Discussed current HEP - pt independent without questions/concerns  Modalities Mechanical lumbar traction x 10 min: max pull 85# (60 sec); min pull 75# (20 sec)  05/28/22 Therex NuStep L4 x 5 min Rows on BATCA 35# 3x10 Lat pull downs on BATCA 35# 3x10 Prone hip extension alternating x 10 reps bil; 3 sec hold Single knee to chest 3x20 sec bil Lower trunk rotation 4x20 sec bil Slump stretch neural flossing 5 x 5 sec hold each position Standing small range lumbar extension 10 x 10 sec hold   PATIENT EDUCATION:  Education details: HEP Person educated: Patient Education method: Consulting civil engineer, Media planner, and Handouts Education comprehension: verbalized understanding, returned demonstration, and needs further education   HOME EXERCISE PROGRAM: Access Code: [email or web address] URL: [email or web address] Date: 05/14/2022 Prepared by: Faustino Congress  Exercises - Supine Lower Trunk Rotation  - 2 x daily - 7 x weekly - 1 sets - 3 reps - 30 sec hold - Hooklying Single Knee to Chest  - 2 x daily - 7 x weekly - 1 sets - 3 reps - 30 sec hold - Supine Piriformis Stretch with  Foot on Ground  - 2 x daily - 7 x weekly - 1 sets -  3 reps - 30 sec hold - Seated Hamstring Stretch  - 2 x daily - 7 x weekly - 1 sets - 3 reps - 30 sec hold - Sidelying Hip Circles  - 2 x daily - 7 x weekly - 1 sets - 10 reps  ASSESSMENT:  CLINICAL IMPRESSION: *** Repeat of prior session as pt had positive response, but did increase tension for traction at pt's request.  Pain in shin resolved with traction at this time.  Will continue to benefit from PT to maximize function.   OBJECTIVE IMPAIRMENTS: decreased mobility, decreased ROM, decreased strength, hypomobility, impaired flexibility, postural dysfunction, and pain.   ACTIVITY LIMITATIONS: carrying, lifting, bending, standing, squatting, and locomotion level  PARTICIPATION LIMITATIONS: meal prep, cleaning, laundry, driving, community activity, and occupation  PERSONAL FACTORS: 3+ comorbidities: OA, hx concussion, OSA, gout, HTN are also affecting patient's functional outcome.   REHAB POTENTIAL: Good  CLINICAL DECISION MAKING: Evolving/moderate complexity  EVALUATION COMPLEXITY: Moderate   GOALS: Goals reviewed with patient? Yes  SHORT TERM GOALS: Target date: 06/04/2022  Independent with initial HEP Goal status: MET 06/04/22  LONG TERM GOALS: Target date: 06/25/2022  Independent with final HEP Goal status: INITIAL  2.  FOTO score improved to 56 Goal status: INITIAL  3.  RLE strength improved to 5/5 for improved function and mobility Goal status: INIITAL  4.  Report pain < 4/10 with standing and walking for improved function Goal status: INITIAL  5.  Report centralization of symptoms for improved tolerance to work activities Goal status: INITIAL   PLAN: PT FREQUENCY: 1-2x/week  PT DURATION: 6 weeks  PLANNED INTERVENTIONS: Therapeutic exercises, Therapeutic activity, Neuromuscular re-education, Gait training, Patient/Family education, Self Care, Joint mobilization, Joint manipulation, Aquatic Therapy, Dry  Needling, Electrical stimulation, Spinal manipulation, Spinal mobilization, Cryotherapy, Moist heat, Taping, Traction, Ionotophoresis 65m/ml Dexamethasone, Manual therapy, and Re-evaluation.  PLAN FOR NEXT SESSION: *** assess traction, review/update HEP PRN, general flexibility and hip/core strengthening, to MD after next session    SLaureen Abrahams PT, DPT 06/11/22 7:25 AM

## 2022-06-19 ENCOUNTER — Ambulatory Visit
Admission: RE | Admit: 2022-06-19 | Discharge: 2022-06-19 | Disposition: A | Discharge status: Home / Self Care | Payer: No Typology Code available for payment source | Source: Ambulatory Visit | Attending: Orthopedic Surgery | Admitting: Orthopedic Surgery

## 2022-06-19 DIAGNOSIS — M79604 Pain in right leg: Secondary | ICD-10-CM | POA: Diagnosis not present

## 2022-06-19 DIAGNOSIS — M4317 Spondylolisthesis, lumbosacral region: Secondary | ICD-10-CM | POA: Diagnosis not present

## 2022-06-19 DIAGNOSIS — M545 Low back pain, unspecified: Secondary | ICD-10-CM | POA: Diagnosis not present

## 2022-06-19 DIAGNOSIS — M4316 Spondylolisthesis, lumbar region: Secondary | ICD-10-CM | POA: Diagnosis not present

## 2022-06-19 DIAGNOSIS — M48061 Spinal stenosis, lumbar region without neurogenic claudication: Secondary | ICD-10-CM | POA: Diagnosis not present

## 2022-06-20 ENCOUNTER — Telehealth: Payer: Self-pay

## 2022-06-20 ENCOUNTER — Other Ambulatory Visit: Payer: Self-pay

## 2022-06-20 DIAGNOSIS — M4317 Spondylolisthesis, lumbosacral region: Secondary | ICD-10-CM

## 2022-06-20 NOTE — Telephone Encounter (Signed)
-----   Message from Newt Minion, MD sent at 06/19/2022  4:55 PM EST ----- Have patient f/u with newton ----- Message ----- From: Interface, Rad Results In Sent: 06/19/2022   2:27 PM EST To: Newt Minion, MD

## 2022-06-20 NOTE — Telephone Encounter (Signed)
Called and advised pt of message below. He would like to proceed with referral.

## 2022-06-26 ENCOUNTER — Ambulatory Visit (INDEPENDENT_AMBULATORY_CARE_PROVIDER_SITE_OTHER): Payer: No Typology Code available for payment source | Admitting: Physical Medicine and Rehabilitation

## 2022-06-26 ENCOUNTER — Encounter: Payer: Self-pay | Admitting: Physical Medicine and Rehabilitation

## 2022-06-26 DIAGNOSIS — M4306 Spondylolysis, lumbar region: Secondary | ICD-10-CM | POA: Diagnosis not present

## 2022-06-26 DIAGNOSIS — M5416 Radiculopathy, lumbar region: Secondary | ICD-10-CM | POA: Diagnosis not present

## 2022-06-26 DIAGNOSIS — M4316 Spondylolisthesis, lumbar region: Secondary | ICD-10-CM | POA: Diagnosis not present

## 2022-06-26 DIAGNOSIS — M4726 Other spondylosis with radiculopathy, lumbar region: Secondary | ICD-10-CM

## 2022-06-26 NOTE — Progress Notes (Signed)
Mitchell Todd - 66 y.o. male MRN [id number]  Date of birth: 1956-07-04  Office Visit Note: Visit Date: 06/26/2022 PCP: Maury Dus, MD Referred by: Maury Dus, MD  Subjective: Chief Complaint  Patient presents with   Lower Back - Pain   HPI: Mitchell Todd is a 66 y.o. male who comes in today per the request of Dr. Meridee Score for evaluation of right sided buttock pain radiating to hip and down anterolateral leg to foot. Pain ongoing for several months and is exacerbated by standing and walking. He describes pain as a sore and aching sensation, currently rates as 8 out of 10. Also reports intermittent paraesthesias to right foot. Some relief of pain with home exercise regimen, rest and use of medications. Minimal relief of pain with Ibuprofen and Prednisone.  Patient recently completed regimen of formal physical therapy with our in house team, no relief of pain with these treatments.  Recent lumbar MRI imaging exhibits bilateral pars defects with 8 mm antero listhesis at the level of L5-S1.  There is also moderate to severe right subarticular stenosis with right L5 nerve root impingement.  Mild to moderate spinal canal stenosis at the levels of L2-L3 and L3-L4. No history of lumbar surgery/injections. Patient is self employed, owns [employer]. Reports severe pain is making is interrupting sleep and making it difficult to perform job duties. Previous history of bilateral knee injections with Dr. Sharol Given. Patient denies focal weakness. Patient denies recent trauma or falls.      Review of Systems  Musculoskeletal:  Positive for back pain.  Neurological:  Positive for tingling. Negative for focal weakness and weakness.  All other systems reviewed and are negative.  Otherwise per HPI.  Assessment & Plan: Visit Diagnoses:    ICD-10-CM   1. Lumbar radiculopathy  M54.16 Ambulatory referral to Physical Medicine Rehab    2. Other spondylosis with radiculopathy, lumbar region   M47.26 Ambulatory referral to Physical Medicine Rehab    3. Pars defect of lumbar spine  M43.06 Ambulatory referral to Physical Medicine Rehab    4. Anterolisthesis of lumbar spine  M43.16 Ambulatory referral to Physical Medicine Rehab       Plan: Findings:  Chronic, worsening and severe right-sided buttock pain radiating to hip and down anterolateral leg to foot.  Patient continues to have severe pain despite good conservative therapy such as formal physical therapy, home exercise regimen, rest and use of medications.  Patient's clinical presentation and exam are consistent with L5 nerve pattern.  Bilateral pars defects, 8 mm anterolisthesis and moderate to severe right subarticular stenosis noted at the level of L5-S1.  Next step is to perform diagnostic and hopefully therapeutic right L5 transforaminal epidural steroid injection under fluoroscopic guidance. I did discuss lumbar epidural steroid injection procedure with patient today, he verbalizes understanding and has no further questions.  If good relief of pain with injection we can repeat this procedure infrequently as needed.  If pain persists postinjection we would consider an interlaminar approach. Also discussed possible surgical referral to Dr. Ileene Rubens if injections do not seem to help relieve his pain. Patient encouraged to remain active as tolerated. No red flag symptoms noted upon exam today.     Meds & Orders: No orders of the defined types were placed in this encounter.   Orders Placed This Encounter  Procedures   Ambulatory referral to Physical Medicine Rehab    Follow-up: Return for Right L5 transforaminal epidural steroid injection.   Procedures:  No procedures performed      Clinical History: EXAM: MRI LUMBAR SPINE WITHOUT CONTRAST   TECHNIQUE: Multiplanar, multisequence MR imaging of the lumbar spine was performed. No intravenous contrast was administered.   COMPARISON:  Lumbar radiographs 04/10/2022    FINDINGS: Segmentation:  5 lumbar vertebra.  Lowest disc space L5-S1   Alignment:  8 mm anterolisthesis L5-S1.  Remaining alignment normal   Vertebrae: Bilateral pars defects of L5. Negative for fracture or mass. Hemangioma L4 vertebral body on the left.   Conus medullaris and cauda equina: Conus extends to the L1-2 level. Conus and cauda equina appear normal.   Paraspinal and other soft tissues: Negative for paraspinous mass or adenopathy   Disc levels:   L1-2: Mild disc degeneration and disc bulging. Borderline spinal stenosis.   L2-3: Disc degeneration with diffuse disc bulging. Bilateral facet degeneration. Mild to moderate spinal stenosis. Mild to moderate subarticular stenosis bilaterally.   L3-4: Disc degeneration with diffuse disc bulging and broad-based central disc protrusion. Bilateral facet degeneration. Mild to moderate central canal stenosis. Mild to moderate subarticular stenosis bilaterally   L4-5: Disc degeneration with disc bulging. Shallow left foraminal disc protrusion. Bilateral facet hypertrophy, moderate in degree. Mild central canal stenosis. Mild right subarticular stenosis and mild to moderate left subarticular stenosis   L5-S1: 8 mm anterolisthesis with bilateral pars defects of L5. Bilateral facet degeneration. Moderate to severe right subarticular stenosis with right L5 nerve root impingement. Moderate left foraminal encroachment.   IMPRESSION: 1. Mild to moderate spinal stenosis L2-3 with mild to moderate subarticular stenosis bilaterally. 2. Mild to moderate central canal stenosis L3-4 with mild to moderate subarticular stenosis bilaterally. 3. Shallow left foraminal disc protrusion L4-5 with mild central canal stenosis. Mild right subarticular stenosis and mild to moderate left subarticular stenosis. 4. 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 Moderate to severe right subarticular stenosis with right L5 nerve root  impingement. Moderate left foraminal encroachment.     Electronically Signed   By: Franchot Gallo M.D.   On: 06/19/2022 14:25   He reports that he has quit smoking. He has never used smokeless tobacco. No results for input(s): "HGBA1C", "LABURIC" in the last 8760 hours.  Objective:  VS:  HT:    WT:   BMI:     BP:   HR: bpm  TEMP: ( )  RESP:  Physical Exam Vitals and nursing note reviewed.  HENT:     Head: Normocephalic and atraumatic.     Right Ear: External ear normal.     Left Ear: External ear normal.     Nose: Nose normal.     Mouth/Throat:     Mouth: Mucous membranes are moist.  Eyes:     Extraocular Movements: Extraocular movements intact.  Cardiovascular:     Rate and Rhythm: Normal rate.     Pulses: Normal pulses.  Pulmonary:     Effort: Pulmonary effort is normal.  Abdominal:     General: Abdomen is flat. There is no distension.  Musculoskeletal:        General: Tenderness present.     Cervical back: Normal range of motion.     Comments: Pt rises from seated position to standing without difficulty. Good lumbar range of motion. Strong distal strength without clonus, no pain upon palpation of greater trochanters. Sensation intact bilaterally. Dysesthesias noted to right L5 dermatome. Walks independently, gait steady.   Skin:    General: Skin is warm and dry.  Capillary Refill: Capillary refill takes less than 2 seconds.  Neurological:     General: No focal deficit present.     Mental Status: He is alert and oriented to person, place, and time.  Psychiatric:        Mood and Affect: Mood normal.        Behavior: Behavior normal.     Ortho Exam  Imaging: No results found.  Past Medical/Family/Surgical/Social History: Medications & Allergies reviewed per EMR, new medications updated. Patient Active Problem List   Diagnosis Date Noted   BPH (benign prostatic hyperplasia) 11/21/2020   CAD 11/14/2007   ESOPHAGEAL STRICTURE 11/14/2007   GERD 11/14/2007    Past Medical History:  Diagnosis Date   Arthritis    knees oa   Concussion 04/2018   no residual from   Eczema    Elevated cholesterol    GERD (gastroesophageal reflux disease)    Gout    Hypertension    Sleep apnea    ues cpap set on 7  uses 2 oe 3 night s per week   Urinary frequency    Wears glasses    History reviewed. No pertinent family history. Past Surgical History:  Procedure Laterality Date   APPENDECTOMY  as child   foot cyst removed Right 2012   TONSILLECTOMY  as child   TRANSURETHRAL RESECTION OF PROSTATE N/A 11/21/2020   Procedure: TRANSURETHRAL RESECTION OF THE PROSTATE (TURP)/ BIPOLAR;  Surgeon: Janith Lima, MD;  Location: [facility];  Service: Urology;  Laterality: N/A;   trigger finger surgeyr  left thumb 2012   Social History   Occupational History   Not on file  Tobacco Use   Smoking status: Former   Smokeless tobacco: Never  Substance and Sexual Activity   Alcohol use: Not on file   Drug use: Not on file   Sexual activity: Not on file

## 2022-06-26 NOTE — Progress Notes (Signed)
Numeric Pain Rating Scale and Functional Assessment Average Pain 4   In the last MONTH (on 0-10 scale) has pain interfered with the following?  1. General activity like being  able to carry out your everyday physical activities such as walking, climbing stairs, carrying groceries, or moving a chair?  Rating(9)    Some activities make pain worse. Sitting makes pain beter

## 2022-06-28 DIAGNOSIS — R350 Frequency of micturition: Secondary | ICD-10-CM | POA: Diagnosis not present

## 2022-06-28 DIAGNOSIS — N401 Enlarged prostate with lower urinary tract symptoms: Secondary | ICD-10-CM | POA: Diagnosis not present

## 2022-07-09 ENCOUNTER — Ambulatory Visit: Payer: Self-pay

## 2022-07-09 ENCOUNTER — Ambulatory Visit (INDEPENDENT_AMBULATORY_CARE_PROVIDER_SITE_OTHER): Payer: No Typology Code available for payment source | Admitting: Physical Medicine and Rehabilitation

## 2022-07-09 VITALS — BP 124/86 | HR 106

## 2022-07-09 DIAGNOSIS — M5416 Radiculopathy, lumbar region: Secondary | ICD-10-CM | POA: Diagnosis not present

## 2022-07-09 MED ORDER — METHYLPREDNISOLONE ACETATE 80 MG/ML IJ SUSP
80.0000 mg | Freq: Once | INTRAMUSCULAR | Status: AC
  Administered 2022-07-09: 80 mg

## 2022-07-09 NOTE — Progress Notes (Signed)
Numeric Pain Rating Scale and Functional Assessment Average Pain 5   In the last MONTH (on 0-10 scale) has pain interfered with the following?  1. General activity like being  able to carry out your everyday physical activities such as walking, climbing stairs, carrying groceries, or moving a chair?  Rating(8)   +Driver, -BT, -Dye Allergies.  Pain in right hip that radiates down the leg to the foot.

## 2022-07-09 NOTE — Patient Instructions (Signed)

## 2022-07-15 NOTE — Progress Notes (Signed)
Mitchell Todd - 66 y.o. male MRN [id number]  Date of birth: [birth year]  Office Visit Note: Visit Date: 07/09/2022 PCP: Maury Dus, MD Referred by: Maury Dus, MD  Subjective: Chief Complaint  Patient presents with   Lower Back - Pain   HPI:  Mitchell Todd is a 66 y.o. male who comes in today at the request of Dr. Meridee Score for planned Right L5-S1 Lumbar Transforaminal epidural steroid injection with fluoroscopic guidance.  The patient has failed conservative care including home exercise, medications, time and activity modification.  This injection will be diagnostic and hopefully therapeutic.  Please see requesting physician notes for further details and justification.   ROS Otherwise per HPI.  Assessment & Plan: Visit Diagnoses:    ICD-10-CM   1. Lumbar radiculopathy  M54.16 XR C-ARM NO REPORT    Epidural Steroid injection    methylPREDNISolone acetate (DEPO-MEDROL) injection 80 mg      Plan: No additional findings.   Meds & Orders:  Meds ordered this encounter  Medications   methylPREDNISolone acetate (DEPO-MEDROL) injection 80 mg    Orders Placed This Encounter  Procedures   XR C-ARM NO REPORT   Epidural Steroid injection    Follow-up: Return if symptoms worsen or fail to improve.   Procedures: No procedures performed  Lumbosacral Transforaminal Epidural Steroid Injection - Sub-Pedicular Approach with Fluoroscopic Guidance  Patient: Mitchell Todd      Date of Birth: 12-09-1955 MRN: [id number] PCP: Maury Dus, MD      Visit Date: 07/09/2022   Universal Protocol:    Date/Time: 07/09/2022  Consent Given By: the patient  Position: PRONE  Additional Comments: Vital signs were monitored before and after the procedure. Patient was prepped and draped in the usual sterile fashion. The correct patient, procedure, and site was verified.   Injection Procedure Details:   Procedure diagnoses: Lumbar radiculopathy [M54.16]    Meds Administered:   Meds ordered this encounter  Medications   methylPREDNISolone acetate (DEPO-MEDROL) injection 80 mg    Laterality: Right  Location/Site: L5  Needle:5.0 in., 22 ga.  Short bevel or Quincke spinal needle  Needle Placement: Transforaminal  Findings:    -Comments: Excellent flow of contrast along the nerve, nerve root and into the epidural space.  Procedure Details: After squaring off the end-plates to get a true AP view, the C-arm was positioned so that an oblique view of the foramen as noted above was visualized. The target area is just inferior to the "nose of the scotty dog" or sub pedicular. The soft tissues overlying this structure were infiltrated with 2-3 ml. of 1% Lidocaine without Epinephrine.  The spinal needle was inserted toward the target using a "trajectory" view along the fluoroscope beam.  Under AP and lateral visualization, the needle was advanced so it did not puncture dura and was located close the 6 O'Clock position of the pedical in AP tracterory. Biplanar projections were used to confirm position. Aspiration was confirmed to be negative for CSF and/or blood. A 1-2 ml. volume of Isovue-250 was injected and flow of contrast was noted at each level. Radiographs were obtained for documentation purposes.   After attaining the desired flow of contrast documented above, a 0.5 to 1.0 ml test dose of 0.25% Marcaine was injected into each respective transforaminal space.  The patient was observed for 90 seconds post injection.  After no sensory deficits were reported, and normal lower extremity motor function was noted,   the above injectate was administered so  that equal amounts of the injectate were placed at each foramen (level) into the transforaminal epidural space.   Additional Comments:  The patient tolerated the procedure well Dressing: 2 x 2 sterile gauze and Band-Aid    Post-procedure details: Patient was observed during the procedure. Post-procedure instructions  were reviewed.  Patient left the clinic in stable condition.    Clinical History: EXAM: MRI LUMBAR SPINE WITHOUT CONTRAST   TECHNIQUE: Multiplanar, multisequence MR imaging of the lumbar spine was performed. No intravenous contrast was administered.   COMPARISON:  Lumbar radiographs 04/10/2022   FINDINGS: Segmentation:  5 lumbar vertebra.  Lowest disc space L5-S1   Alignment:  8 mm anterolisthesis L5-S1.  Remaining alignment normal   Vertebrae: Bilateral pars defects of L5. Negative for fracture or mass. Hemangioma L4 vertebral body on the left.   Conus medullaris and cauda equina: Conus extends to the L1-2 level. Conus and cauda equina appear normal.   Paraspinal and other soft tissues: Negative for paraspinous mass or adenopathy   Disc levels:   L1-2: Mild disc degeneration and disc bulging. Borderline spinal stenosis.   L2-3: Disc degeneration with diffuse disc bulging. Bilateral facet degeneration. Mild to moderate spinal stenosis. Mild to moderate subarticular stenosis bilaterally.   L3-4: Disc degeneration with diffuse disc bulging and broad-based central disc protrusion. Bilateral facet degeneration. Mild to moderate central canal stenosis. Mild to moderate subarticular stenosis bilaterally   L4-5: Disc degeneration with disc bulging. Shallow left foraminal disc protrusion. Bilateral facet hypertrophy, moderate in degree. Mild central canal stenosis. Mild right subarticular stenosis and mild to moderate left subarticular stenosis   L5-S1: 8 mm anterolisthesis with bilateral pars defects of L5. Bilateral facet degeneration. Moderate to severe right subarticular stenosis with right L5 nerve root impingement. Moderate left foraminal encroachment.   IMPRESSION: 1. Mild to moderate spinal stenosis L2-3 with mild to moderate subarticular stenosis bilaterally. 2. Mild to moderate central canal stenosis L3-4 with mild to moderate subarticular stenosis  bilaterally. 3. Shallow left foraminal disc protrusion L4-5 with mild central canal stenosis. Mild right subarticular stenosis and mild to moderate left subarticular stenosis. 4. 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 Moderate to severe right subarticular stenosis with right L5 nerve root impingement. Moderate left foraminal encroachment.     Electronically Signed   By: Franchot Gallo M.D.   On: 06/19/2022 14:25     Objective:  VS:  HT:    WT:   BMI:     BP:124/86  HR:(!) 106bpm  TEMP: ( )  RESP:  Physical Exam   Imaging: No results found.

## 2022-07-15 NOTE — Procedures (Signed)
Lumbosacral Transforaminal Epidural Steroid Injection - Sub-Pedicular Approach with Fluoroscopic Guidance  Patient: [name]      Date of Birth: 03-Jan-1956 MRN: [id number] PCP: Maury Dus, MD      Visit Date: 07/09/2022   Universal Protocol:    Date/Time: 07/09/2022  Consent Given By: the patient  Position: PRONE  Additional Comments: Vital signs were monitored before and after the procedure. Patient was prepped and draped in the usual sterile fashion. The correct patient, procedure, and site was verified.   Injection Procedure Details:   Procedure diagnoses: Lumbar radiculopathy [M54.16]    Meds Administered:  Meds ordered this encounter  Medications   methylPREDNISolone acetate (DEPO-MEDROL) injection 80 mg    Laterality: Right  Location/Site: L5  Needle:5.0 in., 22 ga.  Short bevel or Quincke spinal needle  Needle Placement: Transforaminal  Findings:    -Comments: Excellent flow of contrast along the nerve, nerve root and into the epidural space.  Procedure Details: After squaring off the end-plates to get a true AP view, the C-arm was positioned so that an oblique view of the foramen as noted above was visualized. The target area is just inferior to the "nose of the scotty dog" or sub pedicular. The soft tissues overlying this structure were infiltrated with 2-3 ml. of 1% Lidocaine without Epinephrine.  The spinal needle was inserted toward the target using a "trajectory" view along the fluoroscope beam.  Under AP and lateral visualization, the needle was advanced so it did not puncture dura and was located close the 6 O'Clock position of the pedical in AP tracterory. Biplanar projections were used to confirm position. Aspiration was confirmed to be negative for CSF and/or blood. A 1-2 ml. volume of Isovue-250 was injected and flow of contrast was noted at each level. Radiographs were obtained for documentation purposes.   After attaining the desired flow of  contrast documented above, a 0.5 to 1.0 ml test dose of 0.25% Marcaine was injected into each respective transforaminal space.  The patient was observed for 90 seconds post injection.  After no sensory deficits were reported, and normal lower extremity motor function was noted,   the above injectate was administered so that equal amounts of the injectate were placed at each foramen (level) into the transforaminal epidural space.   Additional Comments:  The patient tolerated the procedure well Dressing: 2 x 2 sterile gauze and Band-Aid    Post-procedure details: Patient was observed during the procedure. Post-procedure instructions were reviewed.  Patient left the clinic in stable condition.

## 2022-08-20 DIAGNOSIS — I10 Essential (primary) hypertension: Secondary | ICD-10-CM | POA: Diagnosis not present

## 2022-08-20 DIAGNOSIS — N400 Enlarged prostate without lower urinary tract symptoms: Secondary | ICD-10-CM | POA: Diagnosis not present

## 2022-08-20 DIAGNOSIS — E7800 Pure hypercholesterolemia, unspecified: Secondary | ICD-10-CM | POA: Diagnosis not present

## 2022-09-17 DIAGNOSIS — R0602 Shortness of breath: Secondary | ICD-10-CM | POA: Diagnosis not present

## 2022-09-17 DIAGNOSIS — Z0000 Encounter for general adult medical examination without abnormal findings: Secondary | ICD-10-CM | POA: Diagnosis not present

## 2022-09-17 DIAGNOSIS — M109 Gout, unspecified: Secondary | ICD-10-CM | POA: Diagnosis not present

## 2022-09-17 DIAGNOSIS — R7303 Prediabetes: Secondary | ICD-10-CM | POA: Diagnosis not present

## 2022-09-17 DIAGNOSIS — Z9181 History of falling: Secondary | ICD-10-CM | POA: Diagnosis not present

## 2022-09-17 DIAGNOSIS — K219 Gastro-esophageal reflux disease without esophagitis: Secondary | ICD-10-CM | POA: Diagnosis not present

## 2022-09-17 DIAGNOSIS — N529 Male erectile dysfunction, unspecified: Secondary | ICD-10-CM | POA: Diagnosis not present

## 2022-09-17 DIAGNOSIS — E7800 Pure hypercholesterolemia, unspecified: Secondary | ICD-10-CM | POA: Diagnosis not present

## 2022-09-17 DIAGNOSIS — N400 Enlarged prostate without lower urinary tract symptoms: Secondary | ICD-10-CM | POA: Diagnosis not present

## 2022-09-17 DIAGNOSIS — G9520 Unspecified cord compression: Secondary | ICD-10-CM | POA: Diagnosis not present

## 2022-09-17 DIAGNOSIS — E291 Testicular hypofunction: Secondary | ICD-10-CM | POA: Diagnosis not present

## 2022-09-17 DIAGNOSIS — I10 Essential (primary) hypertension: Secondary | ICD-10-CM | POA: Diagnosis not present

## 2022-09-20 DIAGNOSIS — D2272 Melanocytic nevi of left lower limb, including hip: Secondary | ICD-10-CM | POA: Diagnosis not present

## 2022-09-20 DIAGNOSIS — D2262 Melanocytic nevi of left upper limb, including shoulder: Secondary | ICD-10-CM | POA: Diagnosis not present

## 2022-09-20 DIAGNOSIS — L821 Other seborrheic keratosis: Secondary | ICD-10-CM | POA: Diagnosis not present

## 2022-09-20 DIAGNOSIS — Z85828 Personal history of other malignant neoplasm of skin: Secondary | ICD-10-CM | POA: Diagnosis not present

## 2022-09-20 DIAGNOSIS — Z85820 Personal history of malignant melanoma of skin: Secondary | ICD-10-CM | POA: Diagnosis not present

## 2022-09-20 DIAGNOSIS — L905 Scar conditions and fibrosis of skin: Secondary | ICD-10-CM | POA: Diagnosis not present

## 2022-09-20 DIAGNOSIS — D225 Melanocytic nevi of trunk: Secondary | ICD-10-CM | POA: Diagnosis not present

## 2022-09-20 DIAGNOSIS — D1801 Hemangioma of skin and subcutaneous tissue: Secondary | ICD-10-CM | POA: Diagnosis not present

## 2022-09-20 DIAGNOSIS — L570 Actinic keratosis: Secondary | ICD-10-CM | POA: Diagnosis not present

## 2022-09-20 DIAGNOSIS — D2261 Melanocytic nevi of right upper limb, including shoulder: Secondary | ICD-10-CM | POA: Diagnosis not present

## 2022-09-20 DIAGNOSIS — D485 Neoplasm of uncertain behavior of skin: Secondary | ICD-10-CM | POA: Diagnosis not present

## 2022-10-04 ENCOUNTER — Ambulatory Visit: Payer: No Typology Code available for payment source

## 2022-10-04 ENCOUNTER — Ambulatory Visit: Payer: No Typology Code available for payment source | Admitting: Pulmonary Disease

## 2022-10-04 ENCOUNTER — Encounter: Payer: Self-pay | Admitting: Pulmonary Disease

## 2022-10-04 VITALS — BP 126/72 | HR 109 | Ht 69.0 in | Wt 205.0 lb

## 2022-10-04 DIAGNOSIS — R0609 Other forms of dyspnea: Secondary | ICD-10-CM

## 2022-10-04 DIAGNOSIS — R0600 Dyspnea, unspecified: Secondary | ICD-10-CM | POA: Diagnosis not present

## 2022-10-04 MED ORDER — TRELEGY ELLIPTA 200-62.5-25 MCG/ACT IN AEPB: 1.0000 | INHALATION_SPRAY | Freq: Every day | RESPIRATORY_TRACT | 0 refills | Status: DC

## 2022-10-04 NOTE — Progress Notes (Signed)
$'@Patient'U$  ID: Mitchell Todd, male    DOB: 07-13-1956, 67 y.o.   MRN: [id number]  Chief Complaint  Patient presents with   Consult    Consult for SOB. Pt states that this started about 2 years ago when he went to go snorkeling. He states but the last 2 months it has gotten worse. Wears cpap at night and states when he wakes up he is coughing a lot and can not go back to sleep.     Referring provider: Olen Cordial, PA  HPI:   67 y.o. man whom we are seeing for evaluation of dyspnea on exertion.  Most recent PCP note x 2 reviewed.  Patient was in usual state of health.  Very active.  Works outside a lot.  Noted went snorkeling 2 years ago in the [place] he could not breathe well through the snorkel.  Very short of breath.  Has returned to the boat.  This has been repeated twice, trips in the last 2 years.  In addition to the initial visit.  Similar symptoms.  His most recent palpating over try.  In addition, over the last several months he has noted worsening dyspnea on exertion.  When carrying bags, heavy items around his property.  No time of day when things are better or worse.  No position to make things better or worse.  Rest makes it better.  Has used albuterol without improvement.  No seasonal or environmental factors he can identify to make things better or worse.  No other alleviating or exacerbating factors.  He reports having a smoking history, 60+ pack year.  Quit 25 years ago.  Denies any history of asthma.  Has had no chest imaging that I can see, reports none.  He did recently have labs, reviewed, no evidence of anemia.  No chest pain.  Not evaluated by cardiology in the past.  Questionaires / Pulmonary Flowsheets:   ACT:      No data to display          MMRC:     No data to display          Epworth:      No data to display          Tests:   FENO:  No results found for: "NITRICOXIDE"  PFT:     No data to display          WALK:      No  data to display          Imaging: No results found.  Lab Results: Personally reviewed CBC    Component Value Date/Time   WBC 9.2 11/22/2020 0244   RBC 4.32 11/22/2020 0244   HGB 13.6 11/22/2020 0244   HCT 40.3 11/22/2020 0244   PLT 188 11/22/2020 0244   MCV 93.3 11/22/2020 0244   MCH 31.5 11/22/2020 0244   MCHC 33.7 11/22/2020 0244   RDW 12.4 11/22/2020 0244    BMET    Component Value Date/Time   NA 135 11/22/2020 0244   K 4.1 11/22/2020 0244   CL 100 11/22/2020 0244   CO2 28 11/22/2020 0244   GLUCOSE 123 (H) 11/22/2020 0244   BUN 17 11/22/2020 0244   CREATININE 1.39 (H) 11/22/2020 0244   CALCIUM 8.6 (L) 11/22/2020 0244   GFRNONAA 56 (L) 11/22/2020 0244    BNP No results found for: "BNP"  ProBNP No results found for: "PROBNP"  Specialty Problems   None   Allergies  Allergen Reactions   Penicillins     Childhood allergy     There is no immunization history on file for this patient.  Past Medical History:  Diagnosis Date   Arthritis    knees oa   Concussion 04/2018   no residual from   Eczema    Elevated cholesterol    GERD (gastroesophageal reflux disease)    Gout    Hypertension    Sleep apnea    ues cpap set on 7  uses 2 oe 3 night s per week   Urinary frequency    Wears glasses     Tobacco History: Social History   Tobacco Use  Smoking Status Former  Smokeless Tobacco Never   Counseling given: Not Answered   Continue to not smoke  Outpatient Encounter Medications as of 10/04/2022  Medication Sig   allopurinol (ZYLOPRIM) 300 MG tablet Take 300 mg by mouth daily.   amLODipine (NORVASC) 5 MG tablet Take 5 mg by mouth daily.   aspirin EC 81 MG tablet Take 81 mg by mouth daily. Swallow whole.   docusate sodium (COLACE) 100 MG capsule Take 1 capsule (100 mg total) by mouth daily as needed for up to 30 doses.   famotidine (PEPCID) 40 MG tablet Take 40 mg by mouth at bedtime.   finasteride (PROSCAR) 5 MG tablet Take 5 mg by mouth  daily.   hydrochlorothiazide (MICROZIDE) 12.5 MG capsule Take 12.5 mg by mouth daily.   Nutritional Supplements (GLUCOSAMINE COMPLEX PO) Take by mouth. With msm 1 tab daily   Pantoprazole Sodium (PROTONIX PO) Take 40 mg by mouth daily.   Rosuvastatin Calcium (CRESTOR PO) Take by mouth. 2 x week   tadalafil (CIALIS) 5 MG tablet Take 5 mg by mouth daily.   telmisartan (MICARDIS) 80 MG tablet Take 80 mg by mouth daily.   [DISCONTINUED] cetirizine (ZYRTEC) 10 MG tablet Take 10 mg by mouth daily.   [DISCONTINUED] mirabegron ER (MYRBETRIQ) 50 MG TB24 tablet Take 50 mg by mouth daily.   [DISCONTINUED] oxyCODONE-acetaminophen (PERCOCET) 5-325 MG tablet Take 1 tablet by mouth every 4 (four) hours as needed for up to 18 doses for severe pain.   [DISCONTINUED] tamsulosin (FLOMAX) 0.4 MG CAPS capsule Take 0.4 mg by mouth daily.   hydrocortisone cream 1 % Apply 1 application topically as needed for itching. (Patient not taking: Reported on 10/04/2022)   [DISCONTINUED] predniSONE (DELTASONE) 10 MG tablet Take 1 tablet (10 mg total) by mouth daily with breakfast.   No facility-administered encounter medications on file as of 10/04/2022.     Review of Systems  Review of Systems  No chest pain with exertion.  No orthopnea or PND.  Comprehensive review systems otherwise negative.    Physical Exam  BP 126/72 (BP Location: Left Arm, Patient Position: Sitting, Cuff Size: Normal)   Pulse (!) 109   Ht '5\' 9"'$  (1.753 m)   Wt 205 lb (93 kg)   SpO2 92%   BMI 30.27 kg/m   Wt Readings from Last 5 Encounters:  10/04/22 205 lb (93 kg)  11/21/20 199 lb 12.8 oz (90.6 kg)    BMI Readings from Last 5 Encounters:  10/04/22 30.27 kg/m  11/21/20 29.51 kg/m     Physical Exam General: Sitting in chair, no acute distress Eyes: EOMI, icterus Neck: Supple, JVP Pulmonary: Clear, no work of breathing Abdomen: Nondistended, bowel sounds present Cardiovascular: Warm, no edema MSK: No synovitis, no joint  effusion Neuro: Normal gait, no weakness Psych: Normal mood,  full affect   Assessment & Plan:   Dyspnea on exertion: First noted a couple years ago with snorkeling which has been persistent and now progressive to activities around his property.  He has an approximate 60+ pack year smoking history.  Is quite possible that this is the primary contributor symptoms.  Possible development of asthma.  Possible cardiac contributors as well.  Albuterol has not helped much.  Trial Trelegy high-dose 1 puff daily.  If helpful we will prescribe, samples today.  Chest x-ray today for further evaluation.  PFTs for further evaluation.  If PFTs unrevealing, will need cardiology evaluation.  Nocturnal cough: Reflux versus etiologies discussed above.  Assess response to inhalers.   Return in about 3 months (around 01/04/2023).   Lanier Clam, MD 10/04/2022

## 2022-10-04 NOTE — Patient Instructions (Signed)
Nice to meet you  Based on your description of symptoms, I am worried about air not getting into your lungs well.  This could be a consequence albeit later in life, of your history of cigarette smoking.  People can develop asthma later in life which also could be contributing to the symptoms  To treat this use Trelegy 1 puff once a day.  Rinse her mouth out thoroughly with water and spit after every use.  Please send a message if the Trelegy is helpful and I can prescribe this.  I provided samples today.  You can continue albuterol as needed, I understand it is not helping much.  To further investigate we will get a chest x-ray today.  To further investigate we will get pulmonary function test in the coming weeks.  Return to clinic in 3 months or sooner as needed with Dr. Silas Flood

## 2022-10-05 NOTE — Progress Notes (Signed)
CXR looks clear which is good

## 2022-10-24 DIAGNOSIS — L988 Other specified disorders of the skin and subcutaneous tissue: Secondary | ICD-10-CM | POA: Diagnosis not present

## 2022-10-24 DIAGNOSIS — Z85828 Personal history of other malignant neoplasm of skin: Secondary | ICD-10-CM | POA: Diagnosis not present

## 2022-10-24 DIAGNOSIS — Z85820 Personal history of malignant melanoma of skin: Secondary | ICD-10-CM | POA: Diagnosis not present

## 2022-10-24 DIAGNOSIS — D485 Neoplasm of uncertain behavior of skin: Secondary | ICD-10-CM | POA: Diagnosis not present

## 2022-10-30 ENCOUNTER — Ambulatory Visit: Payer: No Typology Code available for payment source | Admitting: Pulmonary Disease

## 2022-10-30 DIAGNOSIS — R0609 Other forms of dyspnea: Secondary | ICD-10-CM | POA: Diagnosis not present

## 2022-10-30 LAB — PULMONARY FUNCTION TEST
DL/VA % pred: 101 %
DL/VA: 4.13 ml/min/mmHg/L
DLCO cor % pred: 102 %
DLCO cor: 25.63 ml/min/mmHg
DLCO unc % pred: 102 %
DLCO unc: 25.63 ml/min/mmHg
FEF 25-75 Post: 2.67 L/sec
FEF 25-75 Pre: 1.94 L/sec
FEF2575-%Change-Post: 37 %
FEF2575-%Pred-Post: 111 %
FEF2575-%Pred-Pre: 81 %
FEV1-%Change-Post: 10 %
FEV1-%Pred-Post: 74 %
FEV1-%Pred-Pre: 67 %
FEV1-Post: 2.32 L
FEV1-Pre: 2.09 L
FEV1FVC-%Change-Post: -8 %
FEV1FVC-%Pred-Pre: 106 %
FEV6-%Change-Post: 19 %
FEV6-%Pred-Post: 80 %
FEV6-%Pred-Pre: 67 %
FEV6-Post: 3.19 L
FEV6-Pre: 2.67 L
FEV6FVC-%Change-Post: -1 %
FEV6FVC-%Pred-Post: 104 %
FEV6FVC-%Pred-Pre: 106 %
FVC-%Change-Post: 21 %
FVC-%Pred-Post: 76 %
FVC-%Pred-Pre: 63 %
FVC-Post: 3.23 L
FVC-Pre: 2.67 L
Post FEV1/FVC ratio: 72 %
Post FEV6/FVC ratio: 99 %
Pre FEV1/FVC ratio: 78 %
Pre FEV6/FVC Ratio: 100 %
RV % pred: 81 %
RV: 1.92 L
TLC % pred: 96 %
TLC: 6.51 L

## 2022-10-30 NOTE — Progress Notes (Signed)
Full PFT performed today. °

## 2022-10-30 NOTE — Patient Instructions (Signed)
Full PFT performed today. °

## 2022-11-26 ENCOUNTER — Ambulatory Visit (INDEPENDENT_AMBULATORY_CARE_PROVIDER_SITE_OTHER): Payer: No Typology Code available for payment source | Admitting: Pulmonary Disease

## 2022-11-26 ENCOUNTER — Encounter: Payer: Self-pay | Admitting: Pulmonary Disease

## 2022-11-26 VITALS — BP 127/72 | HR 78 | Temp 98.0°F | Ht 69.0 in | Wt 215.2 lb

## 2022-11-26 DIAGNOSIS — J4540 Moderate persistent asthma, uncomplicated: Secondary | ICD-10-CM

## 2022-11-26 MED ORDER — TRELEGY ELLIPTA 200-62.5-25 MCG/ACT IN AEPB: 1.0000 | INHALATION_SPRAY | Freq: Every day | RESPIRATORY_TRACT | 0 refills | Status: DC

## 2022-11-26 MED ORDER — TRELEGY ELLIPTA 200-62.5-25 MCG/ACT IN AEPB: 1.0000 | INHALATION_SPRAY | Freq: Every day | RESPIRATORY_TRACT | 11 refills | Status: DC

## 2022-11-26 NOTE — Patient Instructions (Signed)
Nice to see you again  Your pulmonary function test showed that when we gave you albuterol your numbers got much better.  This is consistent with a diagnosis of asthma.  I am glad the Trelegy has helped, this makes sense based on your pulmonary function test.  Continue to rinse your mouth out well with water after every use.  I provided samples today.  I sent a prescription, I also provided a co-pay card to help with any expense.  If the inhaler is too expensive, please let me know we will look for an alternative.  Return to clinic in 6 months or sooner as needed with [name]

## 2022-11-26 NOTE — Progress Notes (Signed)
@Patient  ID: Mitchell Todd, male    DOB: 1955-08-17, 67 y.o.   MRN: [id number]  Chief Complaint  Patient presents with   Follow-up    Referring provider: No ref. provider found  HPI:   67 y.o. man whom we are seeing for evaluation of dyspnea on exertion.    Returns for routine follow-up.  Pulmonary function test performed in the interim.  Concern for asthma at last visit.  Samples of high-dose Trelegy given to patient based on severe symptoms.  He does feel like this helped his breathing.  Pulmonary function testing interim demonstrate significant bronchodilator response, otherwise normal.  See below for full interpretation.  We discussed at length the role and rationale for maintenance inhalers for asthma.  Goal for de-escalation in the future, stepdown therapy.  He expressed understanding.  HPI at initial visit: Patient was in usual state of health.  Very active.  Works outside a lot.  Noted went snorkeling 2 years ago in the [place] he could not breathe well through the snorkel.  Very short of breath.  Has returned to the boat.  This has been repeated twice, trips in the last 2 years.  In addition to the initial visit.  Similar symptoms.  His most recent palpating over try.  In addition, over the last several months he has noted worsening dyspnea on exertion.  When carrying bags, heavy items around his property.  No time of day when things are better or worse.  No position to make things better or worse.  Rest makes it better.  Has used albuterol without improvement.  No seasonal or environmental factors he can identify to make things better or worse.  No other alleviating or exacerbating factors.  He reports having a smoking history, 60+ pack year.  Quit 25 years ago.  Denies any history of asthma.  Has had no chest imaging that I can see, reports none.  He did recently have labs, reviewed, no evidence of anemia.  No chest pain.  Not evaluated by cardiology in the  past.  Questionaires / Pulmonary Flowsheets:   ACT:      No data to display          MMRC:     No data to display          Epworth:      No data to display          Tests:   FENO:  No results found for: "NITRICOXIDE"  PFT:    Latest Ref Rng & Units 10/30/2022    8:51 AM  PFT Results  FVC-Pre L 2.67   FVC-Predicted Pre % 63   FVC-Post L 3.23   FVC-Predicted Post % 76   Pre FEV1/FVC % % 78   Post FEV1/FCV % % 72   FEV1-Pre L 2.09   FEV1-Predicted Pre % 67   FEV1-Post L 2.32   DLCO uncorrected ml/min/mmHg 25.63   DLCO UNC% % 102   DLCO corrected ml/min/mmHg 25.63   DLCO COR %Predicted % 102   DLVA Predicted % 101   TLC L 6.51   TLC % Predicted % 96   RV % Predicted % 81   Personally reviewed interpreted as spirometry suggestive of moderate restriction versus air trapping.  Significant bronchodilator response in FVC.  Lung volumes within normal limits.  DLCO within normal limits.  WALK:      No data to display          Imaging: No results  found.  Lab Results: Personally reviewed CBC    Component Value Date/Time   WBC 9.2 11/22/2020 0244   RBC 4.32 11/22/2020 0244   HGB 13.6 11/22/2020 0244   HCT 40.3 11/22/2020 0244   PLT 188 11/22/2020 0244   MCV 93.3 11/22/2020 0244   MCH 31.5 11/22/2020 0244   MCHC 33.7 11/22/2020 0244   RDW 12.4 11/22/2020 0244    BMET    Component Value Date/Time   NA 135 11/22/2020 0244   K 4.1 11/22/2020 0244   CL 100 11/22/2020 0244   CO2 28 11/22/2020 0244   GLUCOSE 123 (H) 11/22/2020 0244   BUN 17 11/22/2020 0244   CREATININE 1.39 (H) 11/22/2020 0244   CALCIUM 8.6 (L) 11/22/2020 0244   GFRNONAA 56 (L) 11/22/2020 0244    BNP No results found for: "BNP"  ProBNP No results found for: "PROBNP"  Specialty Problems   None   Allergies  Allergen Reactions   Penicillins     Childhood allergy     There is no immunization history on file for this patient.  Past Medical History:  Diagnosis  Date   Arthritis    knees oa   Concussion 04/2018   no residual from   Eczema    Elevated cholesterol    GERD (gastroesophageal reflux disease)    Gout    Hypertension    Sleep apnea    ues cpap set on 7  uses 2 oe 3 night s per week   Urinary frequency    Wears glasses     Tobacco History: Social History   Tobacco Use  Smoking Status Former  Smokeless Tobacco Never   Counseling given: Not Answered   Continue to not smoke  Outpatient Encounter Medications as of 11/26/2022  Medication Sig   allopurinol (ZYLOPRIM) 300 MG tablet Take 300 mg by mouth daily.   amLODipine (NORVASC) 5 MG tablet Take 5 mg by mouth daily.   aspirin EC 81 MG tablet Take 81 mg by mouth daily. Swallow whole.   docusate sodium (COLACE) 100 MG capsule Take 1 capsule (100 mg total) by mouth daily as needed for up to 30 doses.   famotidine (PEPCID) 40 MG tablet Take 40 mg by mouth at bedtime.   finasteride (PROSCAR) 5 MG tablet Take 5 mg by mouth daily.   Fluticasone-Umeclidin-Vilant (TRELEGY ELLIPTA) 200-62.5-25 MCG/ACT AEPB Inhale 1 puff into the lungs daily.   Fluticasone-Umeclidin-Vilant (TRELEGY ELLIPTA) 200-62.5-25 MCG/ACT AEPB Inhale 1 puff into the lungs daily.   hydrochlorothiazide (MICROZIDE) 12.5 MG capsule Take 12.5 mg by mouth daily.   hydrocortisone cream 1 % Apply 1 application  topically as needed for itching.   Nutritional Supplements (GLUCOSAMINE COMPLEX PO) Take by mouth. With msm 1 tab daily   Pantoprazole Sodium (PROTONIX PO) Take 40 mg by mouth daily.   Rosuvastatin Calcium (CRESTOR PO) Take by mouth. 2 x week   tadalafil (CIALIS) 5 MG tablet Take 5 mg by mouth daily.   telmisartan (MICARDIS) 80 MG tablet Take 80 mg by mouth daily.   [DISCONTINUED] Fluticasone-Umeclidin-Vilant (TRELEGY ELLIPTA) 200-62.5-25 MCG/ACT AEPB Inhale 1 puff into the lungs daily.   No facility-administered encounter medications on file as of 11/26/2022.     Review of Systems  Review of Systems   N/a  Physical Exam  BP 127/72 (BP Location: Left Arm, Patient Position: Sitting, Cuff Size: Normal)   Pulse 78   Temp 98 F (36.7 C) (Oral)   Ht 5\' 9"  (1.753 m)  Wt 215 lb 3.2 oz (97.6 kg)   SpO2 95%   BMI 31.78 kg/m   Wt Readings from Last 5 Encounters:  11/26/22 215 lb 3.2 oz (97.6 kg)  10/04/22 205 lb (93 kg)  11/21/20 199 lb 12.8 oz (90.6 kg)    BMI Readings from Last 5 Encounters:  11/26/22 31.78 kg/m  10/04/22 30.27 kg/m  11/21/20 29.51 kg/m     Physical Exam General: Sitting in chair, no acute distress Eyes: EOMI, icterus Neck: Supple, JVP Pulmonary: Clear, no work of breathing Abdomen: Nondistended, bowel sounds present Cardiovascular: Warm, no edema MSK: No synovitis, no joint effusion Neuro: Normal gait, no weakness Psych: Normal mood, full affect   Assessment & Plan:   Dyspnea on exertion: First noted a couple years ago with snorkeling which has been persistent and now progressive to activities around his property.  He has an approximate 60+ pack year smoking history.  Is quite possible that this is the primary contributor symptoms.  Possible development of asthma.  Possible cardiac contributors as well.  Albuterol has not helped much.  Chest x-ray clear.  PFTs demonstrate significant bronchodilator response.  Trelegy prescribed last visit, has helped.  New prescription today.  Co-pay card provided.  Samples also provided.  Nocturnal cough: Reflux versus etiologies discussed above.  Seems to be less of an issue since taking Trelegy.   Return in about 6 months (around 05/28/2023).   Karren Burly, MD 11/26/2022

## 2023-01-03 ENCOUNTER — Ambulatory Visit: Payer: No Typology Code available for payment source | Admitting: Internal Medicine

## 2023-01-29 ENCOUNTER — Ambulatory Visit: Payer: No Typology Code available for payment source | Admitting: Internal Medicine

## 2023-02-20 NOTE — Progress Notes (Signed)
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  He has an approximate 60+ pack year smoking history.  Is quite possible that this is the primary contributor symptoms.  Possible development of asthma.  Possible cardiac contributors as well.  Albuterol has not helped much.  Chest x-ray clear.  PFTs demonstrate significant bronchodilator response.  Trelegy prescribed last visit, has helped.  New prescription today.  Co-pay card provided.  Samples also provided.  Nocturnal cough: Reflux versus etiologies discussed above.  Seems to be less of an issue since taking Trelegy.  02/22/23- 67 yoM former smoker for sleep evaluation courtesy of [name], who saw pt 11/26/22 for DOE as above. Hx of OSA Medical problem list includes HTN, CAD, GERD/ esophageal stricture, BPH, Gout,  CPAP Epworth score- Body weight today- ------Patient had sleep study 10-15 years ago. Has been on cpap machine for 15 years His machine is that old and is still working.  He has no contact with the homecare company he got it through originally and has been buying all supplies online.  We discussed replacement and I explained that we would need an updated sleep study since the original was long ago.  He thinks current pressure setting is 7.5 CWP.  He indicates he is sleeping okay.       He had seen [name] for dyspnea on exertion with follow-up appointment in a year.  Working diagnosis is asthma.  He had smoked heavily in the past but has not smoked in years.  He does wheeze occasionally.  He has not noticed any changes using Trelegy.  He primarily notices dyspnea on exertion, a general sense of fatigue or weakness, and a lot of leg cramping suggestive of claudication.  He does not know personal history of cardiac disease and says he saw a cardiologist years ago with no follow-up.  His father  experienced an MI at age 38. PFT showed response to bronchodilator but not significant obstructive disease.  CXR was clear.  ROS-see HPI   + = positive Constitutional:    weight loss, night sweats, fevers, chills, +fatigue, lassitude. HEENT:    headaches, difficulty swallowing, tooth/dental problems, sore throat,       sneezing, itching, ear ache, nasal congestion, post nasal drip, snoring CV:    chest pain, orthopnea, PND, swelling in lower extremities, anasarca,           dizziness, palpitations Resp:   +shortness of breath with exertion or at rest.                productive cough,   non-productive cough, coughing up of blood.              change in color of mucus.  wheezing.   Skin:    rash or lesions. GI:  No-   heartburn, indigestion, abdominal pain, nausea, vomiting, diarrhea,                 change in bowel habits, loss of appetite GU: dysuria, change in color of urine, no urgency or frequency.   flank pain. MS:   joint pain, stiffness, decreased range of motion, back pain.+ leg cramps Neuro-     nothing unusual Psych:  change in mood or affect.  depression or anxiety.   memory loss.  OBJ- Physical Exam General- Alert, Oriented, Affect-appropriate, Distress- none acute Skin- rash-none, lesions- none, excoriation- none  Lymphadenopathy- none Head- atraumatic            Eyes- Gross vision intact, PERRLA, conjunctivae and secretions clear            Ears- Hearing, canals-normal            Nose- Clear, no-Septal dev, mucus, polyps, erosion, perforation             Throat- Mallampati II , mucosa clear , drainage- none, tonsils- atrophic Neck- flexible , trachea midline, no stridor , thyroid nl, carotid no bruit Chest - symmetrical excursion , unlabored           Heart/CV- RRR , no murmur , no gallop  , no rub, nl s1 s2                           - JVD- none , edema- none, stasis changes- none, varices- none           Lung- clear to P&A, wheeze- none, cough- none , dullness-none, rub-  none           Chest wall-  Abd-  Br/ Gen/ Rectal- Not done, not indicated Extrem- cyanosis- none, clubbing, none, atrophy- none, strength- nl Neuro- grossly intact to observation

## 2023-02-22 ENCOUNTER — Ambulatory Visit (INDEPENDENT_AMBULATORY_CARE_PROVIDER_SITE_OTHER): Payer: No Typology Code available for payment source | Admitting: Internal Medicine

## 2023-02-22 ENCOUNTER — Encounter: Payer: Self-pay | Admitting: Internal Medicine

## 2023-02-22 VITALS — BP 116/64 | HR 102 | Ht 68.5 in | Wt 211.0 lb

## 2023-02-22 DIAGNOSIS — G4733 Obstructive sleep apnea (adult) (pediatric): Secondary | ICD-10-CM | POA: Insufficient documentation

## 2023-02-22 DIAGNOSIS — I2510 Atherosclerotic heart disease of native coronary artery without angina pectoris: Secondary | ICD-10-CM | POA: Diagnosis not present

## 2023-02-22 DIAGNOSIS — R0609 Other forms of dyspnea: Secondary | ICD-10-CM | POA: Diagnosis not present

## 2023-02-22 NOTE — Assessment & Plan Note (Signed)
Tentative Dx of asthma.  He notes dyspnea trying to breathe through N95 mask.  Not noticing much response to Trelegy so far. Plan-keep plan to follow-up with [name].  Meanwhile referral for cardiology evaluation.

## 2023-02-22 NOTE — Patient Instructions (Signed)
Order- refer to Cardiology- risk stratification, dyspnea on exertion, claudication  Order- schedule home sleep test   dx OSA Please call us about 2 weeks after your sleep test for results and recommendations

## 2023-02-22 NOTE — Assessment & Plan Note (Signed)
Dyspnea on exertion that may not be fully explained by mild pulmonary limitation on PFT.  Note leg cramps and tendency for legs to go to sleep raising possibility of peripheral arterial disease.  Father had "massive" MI at age 69. Plan-referral for cardiovascular evaluation anticipating risk stratification and evaluation for PAD.

## 2023-02-22 NOTE — Assessment & Plan Note (Signed)
We will update sleep study for documentation, anticipating replacement of his very old CPAP machine

## 2023-03-16 DIAGNOSIS — G4730 Sleep apnea, unspecified: Secondary | ICD-10-CM | POA: Diagnosis not present

## 2023-03-17 DIAGNOSIS — G4730 Sleep apnea, unspecified: Secondary | ICD-10-CM | POA: Diagnosis not present

## 2023-03-27 DIAGNOSIS — H2513 Age-related nuclear cataract, bilateral: Secondary | ICD-10-CM | POA: Diagnosis not present

## 2023-04-04 ENCOUNTER — Ambulatory Visit (INDEPENDENT_AMBULATORY_CARE_PROVIDER_SITE_OTHER): Payer: No Typology Code available for payment source

## 2023-04-04 DIAGNOSIS — G4733 Obstructive sleep apnea (adult) (pediatric): Secondary | ICD-10-CM | POA: Diagnosis not present

## 2023-04-17 DIAGNOSIS — Z008 Encounter for other general examination: Secondary | ICD-10-CM | POA: Diagnosis not present

## 2023-04-17 DIAGNOSIS — G4733 Obstructive sleep apnea (adult) (pediatric): Secondary | ICD-10-CM | POA: Diagnosis not present

## 2023-04-17 DIAGNOSIS — E669 Obesity, unspecified: Secondary | ICD-10-CM | POA: Diagnosis not present

## 2023-04-17 DIAGNOSIS — Z6830 Body mass index (BMI) 30.0-30.9, adult: Secondary | ICD-10-CM | POA: Diagnosis not present

## 2023-04-17 DIAGNOSIS — K219 Gastro-esophageal reflux disease without esophagitis: Secondary | ICD-10-CM | POA: Diagnosis not present

## 2023-04-17 DIAGNOSIS — I10 Essential (primary) hypertension: Secondary | ICD-10-CM | POA: Diagnosis not present

## 2023-04-17 DIAGNOSIS — E785 Hyperlipidemia, unspecified: Secondary | ICD-10-CM | POA: Diagnosis not present

## 2023-04-17 DIAGNOSIS — F17211 Nicotine dependence, cigarettes, in remission: Secondary | ICD-10-CM | POA: Diagnosis not present

## 2023-04-17 DIAGNOSIS — J4540 Moderate persistent asthma, uncomplicated: Secondary | ICD-10-CM | POA: Diagnosis not present

## 2023-05-08 ENCOUNTER — Encounter: Payer: Self-pay | Admitting: Pulmonary Disease

## 2023-05-08 ENCOUNTER — Ambulatory Visit: Payer: No Typology Code available for payment source | Admitting: Pulmonary Disease

## 2023-05-08 VITALS — BP 129/86 | HR 66 | Temp 97.5°F | Ht 68.5 in | Wt 208.0 lb

## 2023-05-08 DIAGNOSIS — H539 Unspecified visual disturbance: Secondary | ICD-10-CM | POA: Diagnosis not present

## 2023-05-08 DIAGNOSIS — R0609 Other forms of dyspnea: Secondary | ICD-10-CM | POA: Diagnosis not present

## 2023-05-08 MED ORDER — ALBUTEROL SULFATE HFA 108 (90 BASE) MCG/ACT IN AERS: 2.0000 | INHALATION_SPRAY | Freq: Four times a day (QID) | RESPIRATORY_TRACT | 11 refills | Status: AC | PRN

## 2023-05-08 NOTE — Patient Instructions (Signed)
I am not convinced the vision changes are related to the Trelegy but it is possible  The most concerning side effect of Trelegy is developing of glaucoma which usually has pain and pressure of the eyes  The steroid and Trelegy could theoretically cause worsening cataracts but I felt this is less likely  Out of abundance of caution please stop Trelegy  Please contact her eye doctor for repeat exam to see if we get a diagnosis and understand if the Trelegy is contributing or not  I prescribed albuterol 2 puffs as needed for shortness of breath.  Take this with you on your trip and you can use it if you become more short of breath while not using the Trelegy  Take the Trelegy with you on your trip just in case the breathing gets so bad you may need to use it in the meantime  Return to clinic in 3 months or sooner as needed with [name]

## 2023-05-08 NOTE — Progress Notes (Signed)
@Patient  ID: Mitchell Todd, male    DOB: 03/31/1956, 67 y.o.   MRN: [id number]  Chief Complaint  Patient presents with   Follow-up    Breathing is about the same. He has noticed vision changes the past 6 wks- asking if this could be from trelegy.     Referring provider: No ref. provider found  HPI:   67 y.o. man whom we are seeing for evaluation of dyspnea on exertion.  Recent sleep evaluation [name], MD reviewed.  Returns for routine follow-up.  Overall is doing well.  Breathing doing well.  Trelegy helps.  Unfortunately the last few weeks, sick weeks or so, worsening vision.  No eye pain or pressure.  No pain with movement of the eyeball.  He was seen by his eye doctor about 10 weeks ago and was given good report, very mild cataract.  We discussed unlikely Trelegy is contributing but there is a theoretical possibility.  We agreed to stop for now and will stay off of his breathing today is okay.  Breathing worsens may need to go back on it.  If the Trelegy is contributing to worsening vision we do not have a lot of good options to treat his breathing in the future.  HPI at initial visit: Patient was in usual state of health.  Very active.  Works outside a lot.  Noted went snorkeling 2 years ago in the [place] he could not breathe well through the snorkel.  Very short of breath.  Has returned to the boat.  This has been repeated twice, trips in the last 2 years.  In addition to the initial visit.  Similar symptoms.  His most recent palpating over try.  In addition, over the last several months he has noted worsening dyspnea on exertion.  When carrying bags, heavy items around his property.  No time of day when things are better or worse.  No position to make things better or worse.  Rest makes it better.  Has used albuterol without improvement.  No seasonal or environmental factors he can identify to make things better or worse.  No other alleviating or exacerbating factors.  He reports  having a smoking history, 60+ pack year.  Quit 25 years ago.  Denies any history of asthma.  Has had no chest imaging that I can see, reports none.  He did recently have labs, reviewed, no evidence of anemia.  No chest pain.  Not evaluated by cardiology in the past.  Questionaires / Pulmonary Flowsheets:   ACT:      No data to display          MMRC:     No data to display          Epworth:      No data to display          Tests:   FENO:  No results found for: "NITRICOXIDE"  PFT:    Latest Ref Rng & Units 10/30/2022    8:51 AM  PFT Results  FVC-Pre L 2.67   FVC-Predicted Pre % 63   FVC-Post L 3.23   FVC-Predicted Post % 76   Pre FEV1/FVC % % 78   Post FEV1/FCV % % 72   FEV1-Pre L 2.09   FEV1-Predicted Pre % 67   FEV1-Post L 2.32   DLCO uncorrected ml/min/mmHg 25.63   DLCO UNC% % 102   DLCO corrected ml/min/mmHg 25.63   DLCO COR %Predicted % 102   DLVA Predicted %  101   TLC L 6.51   TLC % Predicted % 96   RV % Predicted % 81   Personally reviewed interpreted as spirometry suggestive of moderate restriction versus air trapping.  Significant bronchodilator response in FVC.  Lung volumes within normal limits.  DLCO within normal limits.  WALK:      No data to display          Imaging: No results found.  Lab Results: Personally reviewed CBC    Component Value Date/Time   WBC 9.2 11/22/2020 0244   RBC 4.32 11/22/2020 0244   HGB 13.6 11/22/2020 0244   HCT 40.3 11/22/2020 0244   PLT 188 11/22/2020 0244   MCV 93.3 11/22/2020 0244   MCH 31.5 11/22/2020 0244   MCHC 33.7 11/22/2020 0244   RDW 12.4 11/22/2020 0244    BMET    Component Value Date/Time   NA 135 11/22/2020 0244   K 4.1 11/22/2020 0244   CL 100 11/22/2020 0244   CO2 28 11/22/2020 0244   GLUCOSE 123 (H) 11/22/2020 0244   BUN 17 11/22/2020 0244   CREATININE 1.39 (H) 11/22/2020 0244   CALCIUM 8.6 (L) 11/22/2020 0244   GFRNONAA 56 (L) 11/22/2020 0244    BNP No results found  for: "BNP"  ProBNP No results found for: "PROBNP"  Specialty Problems       Pulmonary Problems   Dyspnea on exertion    PFT 10/30/22      OSA (obstructive sleep apnea)    ues cpap set on 7  uses 2 oe 3 night s per week       Allergies  Allergen Reactions   Penicillins     Childhood allergy     There is no immunization history on file for this patient.  Past Medical History:  Diagnosis Date   Arthritis    knees oa   Concussion 04/2018   no residual from   Eczema    Elevated cholesterol    GERD (gastroesophageal reflux disease)    Gout    Hypertension    Sleep apnea    ues cpap set on 7  uses 2 oe 3 night s per week   Urinary frequency    Wears glasses     Tobacco History: Social History   Tobacco Use  Smoking Status Former  Smokeless Tobacco Never   Counseling given: Not Answered   Continue to not smoke  Outpatient Encounter Medications as of 05/08/2023  Medication Sig   albuterol (VENTOLIN HFA) 108 (90 Base) MCG/ACT inhaler Inhale 2 puffs into the lungs every 6 (six) hours as needed for wheezing or shortness of breath.   allopurinol (ZYLOPRIM) 300 MG tablet Take 300 mg by mouth daily.   amLODipine (NORVASC) 5 MG tablet Take 5 mg by mouth daily.   aspirin EC 81 MG tablet Take 81 mg by mouth daily. Swallow whole.   atorvastatin (LIPITOR) 10 MG tablet Take 10 mg by mouth every 7 (seven) days.   famotidine (PEPCID) 40 MG tablet Take 40 mg by mouth at bedtime.   finasteride (PROSCAR) 5 MG tablet Take 5 mg by mouth daily.   Fluticasone-Umeclidin-Vilant (TRELEGY ELLIPTA) 200-62.5-25 MCG/ACT AEPB Inhale 1 puff into the lungs daily.   hydrochlorothiazide (MICROZIDE) 12.5 MG capsule Take 12.5 mg by mouth daily.   hydrocortisone cream 1 % Apply 1 application  topically as needed for itching.   Nutritional Supplements (GLUCOSAMINE COMPLEX PO) Take by mouth. With msm 1 tab daily   Pantoprazole Sodium (  PROTONIX PO) Take 40 mg by mouth daily.   tadalafil (CIALIS)  5 MG tablet Take 5 mg by mouth daily.   telmisartan (MICARDIS) 80 MG tablet Take 80 mg by mouth daily.   [DISCONTINUED] docusate sodium (COLACE) 100 MG capsule Take 1 capsule (100 mg total) by mouth daily as needed for up to 30 doses.   [DISCONTINUED] Rosuvastatin Calcium (CRESTOR PO) Take by mouth. 2 x week   No facility-administered encounter medications on file as of 05/08/2023.     Review of Systems  Review of Systems  N/a  Physical Exam  BP 129/86 (BP Location: Right Arm, Cuff Size: Normal)   Pulse 66   Temp (!) 97.5 F (36.4 C) (Oral)   Ht 5' 8.5" (1.74 m)   Wt 208 lb (94.3 kg)   SpO2 93% Comment: on RA  BMI 31.17 kg/m   Wt Readings from Last 5 Encounters:  05/08/23 208 lb (94.3 kg)  02/22/23 211 lb (95.7 kg)  11/26/22 215 lb 3.2 oz (97.6 kg)  10/04/22 205 lb (93 kg)  11/21/20 199 lb 12.8 oz (90.6 kg)    BMI Readings from Last 5 Encounters:  05/08/23 31.17 kg/m  02/22/23 31.62 kg/m  11/26/22 31.78 kg/m  10/04/22 30.27 kg/m  11/21/20 29.51 kg/m     Physical Exam General: Sitting in chair, no acute distress Eyes: EOMI, icterus Neck: Supple, JVP Pulmonary: Clear, no work of breathing Abdomen: Nondistended, bowel sounds present Cardiovascular: Warm, no edema MSK: No synovitis, no joint effusion Neuro: Normal gait, no weakness Psych: Normal mood, full affect   Assessment & Plan:   Dyspnea on exertion: First noted a couple years ago with snorkeling which has been persistent and now progressive to activities around his property.  He has an approximate 60+ pack year smoking history.  Is quite possible that this is the primary contributor symptoms.  Possible development of asthma.  Possible cardiac contributors as well.  Albuterol has not helped much.  Chest x-ray clear.  PFTs demonstrate significant bronchodilator response.  Symptoms improved with Trelegy stopping for now given concern for eye changes.  Albuterol as needed prescribed today in the interim  while we assess.  Nocturnal cough: Reflux versus etiologies discussed above.  Seems to be less of an issue since taking Trelegy.  Stomach allergy as above  Vision changes: Recently examined and everything okay very early cataracts per his report.  That was about 10 weeks ago.  Since about 6 weeks a bit worsening vision.  No eye pain or pressure to suggest glaucoma which would be most worrisome.  Possible inhaled steroids contributing worsening cataracts but it seems unlikely.  Advised him to stop Trelegy see if he gets better.  Advised him to contact his eye doctor for repeat exams we can come to a diagnosis understand if Trelegy is contributing or not.   Return in about 3 months (around 08/08/2023) for f/u [name].   Karren Burly, MD 05/08/2023

## 2023-05-23 ENCOUNTER — Other Ambulatory Visit (HOSPITAL_COMMUNITY): Payer: Self-pay

## 2023-05-23 ENCOUNTER — Ambulatory Visit (HOSPITAL_BASED_OUTPATIENT_CLINIC_OR_DEPARTMENT_OTHER): Payer: No Typology Code available for payment source | Admitting: Cardiology

## 2023-05-23 ENCOUNTER — Encounter (HOSPITAL_BASED_OUTPATIENT_CLINIC_OR_DEPARTMENT_OTHER): Payer: Self-pay | Admitting: Cardiology

## 2023-05-23 VITALS — BP 98/72 | HR 97 | Ht 68.5 in | Wt 208.5 lb

## 2023-05-23 DIAGNOSIS — Z7189 Other specified counseling: Secondary | ICD-10-CM | POA: Diagnosis not present

## 2023-05-23 DIAGNOSIS — R252 Cramp and spasm: Secondary | ICD-10-CM

## 2023-05-23 DIAGNOSIS — I10 Essential (primary) hypertension: Secondary | ICD-10-CM

## 2023-05-23 DIAGNOSIS — E7800 Pure hypercholesterolemia, unspecified: Secondary | ICD-10-CM | POA: Diagnosis not present

## 2023-05-23 DIAGNOSIS — R0609 Other forms of dyspnea: Secondary | ICD-10-CM

## 2023-05-23 MED ORDER — IVABRADINE HCL 5 MG PO TABS
15.0000 mg | ORAL_TABLET | Freq: Once | ORAL | 0 refills | Status: AC
  Filled 2023-05-23: qty 3, 1d supply, fill #0

## 2023-05-23 MED ORDER — IVABRADINE HCL 5 MG PO TABS
15.0000 mg | ORAL_TABLET | Freq: Once | ORAL | 0 refills | Status: DC
  Filled 2023-05-23: qty 2, 1d supply, fill #0

## 2023-05-23 NOTE — Patient Instructions (Addendum)
Stop hydrochlorothiazide. Check your blood pressure at home. If your blood pressure is less than 110 on the top number after stopping hydrochlorothiazide, then I want you to stop the amlodipine. Goal is for top number blood pressure to be less than 130 but more than 100 (ideally around 110).  how to check blood pressure:  -sit comfortably in a chair, feet uncrossed and flat on floor, for 5-10 minutes  -arm ideally should rest at the level of the heart. However, arm should be relaxed and not tense (for example, do not hold the arm up unsupported)  -avoid exercise, caffeine, and tobacco for at least 30 minutes prior to BP reading  -don't take BP cuff reading over clothes (always place on skin directly)  -I prefer to know how well the medication is working, so I would like you to take your readings 1-2 hours after taking your blood pressure medication if possible   SEND READINGS VIA MYCHART OR CALL [phone]  Labwork: BMET 1 WEEK  I'd like you to get labs in about a week to check your kidney function before the test (and off the hydrochlorothiazide).  Testing/Procedures: Your physician has requested that you have an echocardiogram. Echocardiography is a painless test that uses sound waves to create images of your heart. It provides your doctor with information about the size and shape of your heart and how well your heart's chambers and valves are working. This procedure takes approximately one hour. There are no restrictions for this procedure. Please do NOT wear cologne, perfume, aftershave, or lotions (deodorant is allowed). Please arrive 15 minutes prior to your appointment time.  Your physician has requested that you have cardiac CT. Cardiac computed tomography (CT) is a painless test that uses an x-ray machine to take clear, detailed pictures of your heart. For further information please visit [email or web address] Please follow instruction sheet as given. TAKE IVABRADINE 15 MG (3 TABLETS)  2 HOURS PRIOR TO CT   DO NOT TAKE YOUR AMLODIPINE OR TELMISARTAN MORNING OF CT   Follow-Up: TO BE DETERMINED BASED ON RESULTS OF ABOVE   Any Other Special Instructions Will Be Listed Below (If Applicable).    Your cardiac CT will be scheduled at one of the below locations:   [facility] [address] [place], [place] [address] [phone]  OR  [facility] [address] [place], [place] [address] [phone]  OR   [facility] [address] [place], [place] [address] [phone]  If scheduled at [facility], please arrive at the [facility] (Entrance C2) of [facility] 30 minutes prior to test start time. You can use the FREE valet parking offered at entrance C (encouraged to control the heart rate for the test)  Proceed to the [facility] Radiology Department (first floor) to check-in and test prep.  All radiology patients and guests should use entrance C2 at [facility], accessed from [facility], even though the hospital's physical address listed is [address]    If scheduled at [facility] or [facility], please arrive 15 mins early for check-in and test prep.  There is spacious parking and easy access to the radiology department from the [facility] entrance. Please enter here and check-in with the desk attendant.   Please follow these instructions carefully (unless otherwise directed):  An IV will be required for this test and Nitroglycerin  will be given.  Hold all erectile dysfunction medications at least 3 days (72 hrs) prior to test. (Ie viagra, cialis, sildenafil, tadalafil, etc)   On the Night Before the Test: Be sure to Drink plenty of water. Do not consume any caffeinated/decaffeinated beverages or chocolate 12 hours prior to your test. Do  not take any antihistamines 12 hours prior to your test.  On the Day of the Test: Drink plenty of water until 1 hour prior to the test. Do not eat any food 1 hour prior to test. You may take your regular medications prior to the test.  Take metoprolol (Lopressor) two hours prior to test. If you take Furosemide/Hydrochlorothiazide/Spironolactone, please HOLD on the morning of the test. FEMALES- please wear underwire-free bra if available, avoid dresses & tight clothing      After the Test: Drink plenty of water. After receiving IV contrast, you may experience a mild flushed feeling. This is normal. On occasion, you may experience a mild rash up to 24 hours after the test. This is not dangerous. If this occurs, you can take Benadryl 25 mg and increase your fluid intake. If you experience trouble breathing, this can be serious. If it is severe call 911 IMMEDIATELY. If it is mild, please call our office. If you take any of these medications: Glipizide/Metformin, Avandament, Glucavance, please do not take 48 hours after completing test unless otherwise instructed.  We will call to schedule your test 2-4 weeks out understanding that some insurance companies will need an authorization prior to the service being performed.   For more information and frequently asked questions, please visit our website : [email or web address]  For non-scheduling related questions, please contact the cardiac imaging nurse navigator should you have any questions/concerns: Cardiac Imaging Nurse Navigators Direct Office Dial: [phone]   For scheduling needs, including cancellations and rescheduling, please call [place], [phone].

## 2023-05-23 NOTE — Addendum Note (Signed)
Addended by: Regis Bill B on: 05/23/2023 01:26 PM   Modules accepted: Orders

## 2023-05-23 NOTE — Progress Notes (Signed)
Cardiology Office Note:  .    Date:  05/23/2023  ID:  Mitchell Todd, DOB 01/29/1956, MRN [id number] PCP: Elias Else, MD (Inactive)  [facility] Providers Cardiologist:  Jodelle Red, MD     History of Present Illness: .    Mitchell Todd is a 67 y.o. male with a hx of hypertension, CAD, GERD/esophageal stricture, BPH, gout, OSA on CPAP, prior tobacco use, here for the evaluation of dyspnea on exertion at the request of Dr. Jetty Duhamel. Followed by [name] in pulmonology. He was seen by Dr. Maple Hudson 02/22/2023 and also complained of general fatigue/weakness and a lot of leg cramping suggestive of claudication. Also noted that his father had a massive MI at age 79. He was referred to cardiology for further evaluation. On 05/08/2023 he followed up with [name] and reported that Trelegy was helping but he had been experiencing worsening vision so Trelegy was held.  Cardiovascular risk factors: Prior clinical ASCVD:  Carotid dopplers 04/2020 showed less than 50% stenosis secondary to mild atherosclerotic plaque formation at the carotid bulb. Currently taking pravastatin 20 mg once weekly, previously on Crestor. Muscle cramping has been persistent despite statin titrations.  Comorbid conditions:  Hypertension - He has been on antihypertensives for 3-4 years. In the office his blood pressure is 98/72, currently on 3 agents.  Metabolic syndrome/Obesity:  Highest adult weight 205 lbs. Current weight 208 lbs in the office, but he was 200 lbs this morning at home. Chronic inflammatory conditions: None. Tobacco use history: Former smoker. He quit 25-30 years ago. At his peak he was smoking 3 packs per day.  Family history: His father had an MI at age 31. Prior pertinent testing and/or incidental findings:  Normal stress test 03/2000. Exercise level: He complains of feeling fatigued all the time with no energy. [place], completes ceramic tile work. Can't breathe  especially when picking up and carrying objects upstairs. After a few minutes of rest he is able to return to work. No loss of consciousness. May experience significant dyspnea while sitting in a chair, but this rare. He is using his CPAP at night. Previously enjoyed scuba diving, snorkeling. A couple years ago he became dyspneic after a short time in the water, prompting his subsequent pulmonary evaluations. He confirms that his eye focus has improved since stopping Trelegy. Additionally he complains of muscle cramping usually in his bilateral hands, feet, and calf muscles. The cramping is worse while lying in bed, and has woken him from sleep. No muscle cramping when he walks; however, he does complain of numbness and skin discoloration of his feet. Current diet: High sodium diet. Known indigestion/acid reflux, but no chest pain concerning of cardiac etiology.  He denies any palpitations, peripheral edema, lightheadedness, headaches, syncope.  ROS:  Please see the history of present illness. ROS otherwise negative except as noted.  (+) Shortness of breath (+) Muscle cramping of bilateral hands, feet, calves (+) Numbness/skin discoloration of bilateral feet (+) Intermittent acid reflux/indigestion  Studies Reviewed: Marland Kitchen    EKG Interpretation Date/Time:  Thursday May 23 2023 10:57:50 EDT Ventricular Rate:  69 PR Interval:  140 QRS Duration:  144 QT Interval:  426 QTC Calculation: 456 R Axis:   12  Text Interpretation: Normal sinus rhythm Right bundle branch block Confirmed by Jodelle Red [phone]) on 05/23/2023 11:02:20 AM    Chest X-Ray  10/04/2022: FINDINGS: On the frontal projection a paper clip is noted projecting over the lower left hemithorax,  not localized on the lateral projection (presumably exterior to the patient on the frontal projection and removed on lateral). Lung volumes are low. No consolidative airspace disease. No pleural effusions. No pneumothorax. No  pulmonary nodule or mass noted. Pulmonary vasculature and the cardiomediastinal silhouette are within normal limits.   IMPRESSION: 1. Low lung volumes without radiographic evidence of acute cardiopulmonary disease.  Bilateral Carotid Dopplers  05/24/2020: IMPRESSION: 1. Right carotid artery system: Patent without significant atherosclerotic plaque formation.   2. Left carotid artery system: Less than 50% stenosis secondary to mild atherosclerotic plaque formation at the carotid bulb.   3.  Vertebral artery system: Patent with antegrade flow bilaterally.   Physical Exam:    VS:  BP 98/72   Pulse 97   Ht 5' 8.5" (1.74 m)   Wt 208 lb 8 oz (94.6 kg)   SpO2 92%   BMI 31.24 kg/m    Wt Readings from Last 3 Encounters:  05/23/23 208 lb 8 oz (94.6 kg)  05/08/23 208 lb (94.3 kg)  02/22/23 211 lb (95.7 kg)    GEN: Well nourished, well developed in no acute distress HEENT: Normal, moist mucous membranes NECK: No JVD CARDIAC: regular rhythm, normal S1 and S2, no rubs or gallops. No murmur. VASCULAR: Radial and DP pulses 2+ bilaterally. No carotid bruits RESPIRATORY:  Clear to auscultation without rales, wheezing or rhonchi  ABDOMEN: Soft, non-tender, non-distended MUSCULOSKELETAL:  Ambulates independently SKIN: Warm and dry, no edema NEUROLOGIC:  Alert and oriented x 3. No focal neuro deficits noted. PSYCHIATRIC:  Normal affect   ASSESSMENT AND PLAN: .    Dyspnea on exertion -followed by [name] in pulmonology. Improved on trelegy but had side effects -OSA on CPAP -prior heavy tobacco use -will get echo to rule out structural heart disease -he has significant risk factors for CAD. This could be angina. -discussed treadmill stress, nuclear stress/lexiscan, and CT coronary angiography. Discussed pros and cons of each, including but not limited to false positive/false negative risk, radiation risk, and risk of IV contrast dye. Based on shared decision making, decision was  made to pursue CT coronary angiography. -will give one time dose of ivabradine 2 hours prior to scheduled test, given low blood pressure today -counseled on need to get BMET prior to test -counseled on use of sublingual nitroglycerin and its importance to a good test. Hold BP meds day of test to have room for ng -reviewed red flag warning signs that need immediate medical attention   Hypotension today, with history of hypertension -stop hydrochlorothiazide, track home BP. If systolic still <100, stop amlodipine  Leg cramps Hyperlipidemia -strong DP pulses bilaterally. Cramps not changed on/off statin or on different doses.  -if significant CAD on CT, would return to rosuvastatin daily dosing as he reports his cramping was the same on this  Dispo: Follow-up TBD based on results of testing, or sooner as needed.  I,Mathew Stumpf,acting as a Neurosurgeon for [employer], MD.,have documented all relevant documentation on the behalf of Jodelle Red, MD,as directed by  Jodelle Red, MD while in the presence of Jodelle Red, MD.  I, Jodelle Red, MD, have reviewed all documentation for this visit. The documentation on 05/23/23 for the exam, diagnosis, procedures, and orders are all accurate and complete.   Signed, Jodelle Red, MD

## 2023-05-25 NOTE — Progress Notes (Unsigned)
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  He has an approximate 60+ pack year smoking history.  Is quite possible that this is the primary contributor symptoms.  Possible development of asthma.  Possible cardiac contributors as well.  Albuterol has not helped much.  Chest x-ray clear.  PFTs demonstrate significant bronchodilator response.  Trelegy prescribed last visit, has helped.  New prescription today.  Co-pay card provided.  Samples also provided.  Nocturnal cough: Reflux versus etiologies discussed above.  Seems to be less of an issue since taking Trelegy.  02/22/23- 30 yoM former smoker for sleep evaluation courtesy of [name], who saw pt 11/26/22 for DOE as above. Hx of OSA Medical problem list includes HTN, CAD, GERD/ esophageal stricture, BPH, Gout,  CPAP Epworth score- Body weight today- ------Patient had sleep study 10-15 years ago. Has been on cpap machine for 15 years His machine is that old and is still working.  He has no contact with the homecare company he got it through originally and has been buying all supplies online.  We discussed replacement and I explained that we would need an updated sleep study since the original was long ago.  He thinks current pressure setting is 7.5 CWP.  He indicates he is sleeping okay.       He had seen [name] for dyspnea on exertion with follow-up appointment in a year.  Working diagnosis is asthma.  He had smoked heavily in the past but has not smoked in years.  He does wheeze occasionally.  He has not noticed any changes using Trelegy.  He primarily notices dyspnea on exertion, a general sense of fatigue or weakness, and a lot of leg cramping suggestive of claudication.  He does not know personal history of cardiac disease and says he saw a cardiologist years ago with no follow-up.  His father  experienced an MI at age 5. PFT showed response to bronchodilator but not significant obstructive disease.  CXR was clear.  05/27/23- 67yoM former smoker (60+ pkyrs) followed for OSA, complicated by   HTN, CAD, GERD/ esophageal stricture, BPH, Gout,  Followed by Dr Judeth Horn for general pulmonary. HST 03/16/23-  AHI 12.4/hr, desaturation to 74%, body weight 202 lbs For treatment decision- Body weight today-   ROS-see HPI   + = positive Constitutional:    weight loss, night sweats, fevers, chills, +fatigue, lassitude. HEENT:    headaches, difficulty swallowing, tooth/dental problems, sore throat,       sneezing, itching, ear ache, nasal congestion, post nasal drip, snoring CV:    chest pain, orthopnea, PND, swelling in lower extremities, anasarca,           dizziness, palpitations Resp:   +shortness of breath with exertion or at rest.                productive cough,   non-productive cough, coughing up of blood.              change in color of mucus.  wheezing.   Skin:    rash or lesions. GI:  No-   heartburn, indigestion, abdominal pain, nausea, vomiting, diarrhea,                 change in bowel habits, loss of appetite GU: dysuria, change in color of urine, no urgency or frequency.   flank pain. MS:   joint pain,  stiffness, decreased range of motion, back pain.+ leg cramps Neuro-     nothing unusual Psych:  change in mood or affect.  depression or anxiety.   memory loss.  OBJ- Physical Exam General- Alert, Oriented, Affect-appropriate, Distress- none acute Skin- rash-none, lesions- none, excoriation- none Lymphadenopathy- none Head- atraumatic            Eyes- Gross vision intact, PERRLA, conjunctivae and secretions clear            Ears- Hearing, canals-normal            Nose- Clear, no-Septal dev, mucus, polyps, erosion, perforation             Throat- Mallampati II , mucosa clear , drainage- none, tonsils- atrophic Neck- flexible , trachea midline, no stridor , thyroid nl,  carotid no bruit Chest - symmetrical excursion , unlabored           Heart/CV- RRR , no murmur , no gallop  , no rub, nl s1 s2                           - JVD- none , edema- none, stasis changes- none, varices- none           Lung- clear to P&A, wheeze- none, cough- none , dullness-none, rub- none           Chest wall-  Abd-  Br/ Gen/ Rectal- Not done, not indicated Extrem- cyanosis- none, clubbing, none, atrophy- none, strength- nl Neuro- grossly intact to observation

## 2023-05-27 ENCOUNTER — Ambulatory Visit (INDEPENDENT_AMBULATORY_CARE_PROVIDER_SITE_OTHER): Payer: No Typology Code available for payment source | Admitting: Internal Medicine

## 2023-05-27 ENCOUNTER — Encounter: Payer: Self-pay | Admitting: Internal Medicine

## 2023-05-27 VITALS — BP 119/84 | HR 72 | Temp 97.9°F | Ht 68.5 in | Wt 212.8 lb

## 2023-05-27 DIAGNOSIS — R0609 Other forms of dyspnea: Secondary | ICD-10-CM

## 2023-05-27 DIAGNOSIS — G4733 Obstructive sleep apnea (adult) (pediatric): Secondary | ICD-10-CM | POA: Diagnosis not present

## 2023-05-27 NOTE — Assessment & Plan Note (Signed)
He will follow with Dr Judeth Horn and with Cardiology

## 2023-05-27 NOTE — Patient Instructions (Signed)
Order- new DME, replace old CPAP machine based on HST 03/16/23- auto 5-15, mask of choice, humidifier, supplies, please install download capability AirView/ card  Please call if we can help

## 2023-05-27 NOTE — Assessment & Plan Note (Signed)
Persistent OSA Plan- establish DME to replace old CPAP machine. Discussed change to autopap 5-15

## 2023-06-03 ENCOUNTER — Other Ambulatory Visit (HOSPITAL_COMMUNITY): Payer: Self-pay

## 2023-06-03 DIAGNOSIS — R0609 Other forms of dyspnea: Secondary | ICD-10-CM | POA: Diagnosis not present

## 2023-06-03 DIAGNOSIS — I10 Essential (primary) hypertension: Secondary | ICD-10-CM | POA: Diagnosis not present

## 2023-06-04 LAB — BASIC METABOLIC PANEL
BUN/Creatinine Ratio: 12 (ref 10–24)
BUN: 14 mg/dL (ref 8–27)
CO2: 22 mmol/L (ref 20–29)
Calcium: 9.1 mg/dL (ref 8.6–10.2)
Chloride: 101 mmol/L (ref 96–106)
Creatinine, Ser: 1.19 mg/dL (ref 0.76–1.27)
Glucose: 115 mg/dL — ABNORMAL HIGH (ref 70–99)
Potassium: 4.3 mmol/L (ref 3.5–5.2)
Sodium: 142 mmol/L (ref 134–144)
eGFR: 66 mL/min/{1.73_m2} (ref >59)

## 2023-06-05 ENCOUNTER — Telehealth (HOSPITAL_BASED_OUTPATIENT_CLINIC_OR_DEPARTMENT_OTHER): Payer: Self-pay

## 2023-06-05 NOTE — Telephone Encounter (Signed)
Called patient and discussed lab results. He verbalized understanding.  ----- Message from Jodelle Red sent at 06/05/2023 10:43 AM EST ----- Kidney function is stable and appropriate for CT scan

## 2023-06-05 NOTE — Telephone Encounter (Signed)
-----   Message from [facility] sent at 06/05/2023 10:43 AM EST ----- Kidney function is stable and appropriate for CT scan

## 2023-06-11 ENCOUNTER — Telehealth (HOSPITAL_COMMUNITY): Payer: Self-pay | Admitting: Emergency Medicine

## 2023-06-11 ENCOUNTER — Ambulatory Visit (INDEPENDENT_AMBULATORY_CARE_PROVIDER_SITE_OTHER): Payer: No Typology Code available for payment source

## 2023-06-11 DIAGNOSIS — E7800 Pure hypercholesterolemia, unspecified: Secondary | ICD-10-CM

## 2023-06-11 DIAGNOSIS — R0609 Other forms of dyspnea: Secondary | ICD-10-CM | POA: Diagnosis not present

## 2023-06-11 DIAGNOSIS — I10 Essential (primary) hypertension: Secondary | ICD-10-CM

## 2023-06-11 LAB — ECHOCARDIOGRAM COMPLETE
Area-P 1/2: 3.53 cm2
S' Lateral: 2.88 cm

## 2023-06-11 MED ORDER — PERFLUTREN LIPID MICROSPHERE
1.0000 mL | INTRAVENOUS | Status: AC | PRN
  Administered 2023-06-11: 1 mL via INTRAVENOUS

## 2023-06-11 NOTE — Telephone Encounter (Signed)
Reaching out to patient to offer assistance regarding upcoming cardiac imaging study; pt verbalizes understanding of appt date/time, parking situation and where to check in, pre-test NPO status and medications ordered, and verified current allergies; name and call back number provided for further questions should they arise Cayne Yom RN Navigator Cardiac Imaging [facility] [phone] office [phone] cell 

## 2023-06-12 ENCOUNTER — Ambulatory Visit
Admission: RE | Admit: 2023-06-12 | Discharge: 2023-06-12 | Disposition: A | Discharge status: Home / Self Care | Payer: No Typology Code available for payment source | Source: Ambulatory Visit | Attending: Cardiology | Admitting: Cardiology

## 2023-06-12 DIAGNOSIS — E7800 Pure hypercholesterolemia, unspecified: Secondary | ICD-10-CM | POA: Diagnosis not present

## 2023-06-12 DIAGNOSIS — I2510 Atherosclerotic heart disease of native coronary artery without angina pectoris: Secondary | ICD-10-CM | POA: Diagnosis not present

## 2023-06-12 DIAGNOSIS — I10 Essential (primary) hypertension: Secondary | ICD-10-CM | POA: Insufficient documentation

## 2023-06-12 DIAGNOSIS — R0609 Other forms of dyspnea: Secondary | ICD-10-CM | POA: Insufficient documentation

## 2023-06-12 MED ORDER — NITROGLYCERIN 0.4 MG SL SUBL
0.8000 mg | SUBLINGUAL_TABLET | Freq: Once | SUBLINGUAL | Status: AC
  Administered 2023-06-12: 0.8 mg via SUBLINGUAL
  Filled 2023-06-12: qty 25

## 2023-06-12 MED ORDER — METOPROLOL TARTRATE 5 MG/5ML IV SOLN: 5.0000 mg | Freq: Once | INTRAVENOUS | Status: DC

## 2023-06-12 MED ORDER — METOPROLOL TARTRATE 5 MG/5ML IV SOLN
10.0000 mg | Freq: Once | INTRAVENOUS | Status: AC
  Administered 2023-06-12: 10 mg via INTRAVENOUS
  Filled 2023-06-12: qty 10

## 2023-06-12 MED ORDER — IOHEXOL 350 MG/ML SOLN
80.0000 mL | Freq: Once | INTRAVENOUS | Status: AC | PRN
  Administered 2023-06-12: 80 mL via INTRAVENOUS

## 2023-06-12 NOTE — Progress Notes (Signed)
Pt tolerated procedure well with no issues. Pt ABCs intact. Pt denies any complaints. Pt encouraged to drink plenty of water throughout the day. Pt ambulatory with steady gait.

## 2023-06-13 DIAGNOSIS — N401 Enlarged prostate with lower urinary tract symptoms: Secondary | ICD-10-CM | POA: Diagnosis not present

## 2023-06-13 DIAGNOSIS — N5201 Erectile dysfunction due to arterial insufficiency: Secondary | ICD-10-CM | POA: Diagnosis not present

## 2023-06-13 DIAGNOSIS — R350 Frequency of micturition: Secondary | ICD-10-CM | POA: Diagnosis not present

## 2023-06-13 DIAGNOSIS — Z125 Encounter for screening for malignant neoplasm of prostate: Secondary | ICD-10-CM | POA: Diagnosis not present

## 2023-06-20 DIAGNOSIS — G4733 Obstructive sleep apnea (adult) (pediatric): Secondary | ICD-10-CM | POA: Diagnosis not present

## 2023-07-01 ENCOUNTER — Telehealth: Payer: Self-pay | Admitting: Cardiology

## 2023-07-01 NOTE — Telephone Encounter (Signed)
Asher Muir from [facility] Radiology is calling with critical results. Transferred the call to triage.

## 2023-07-01 NOTE — Telephone Encounter (Signed)
Received call report on Cardiac CT   IMPRESSION: 1. 10 mm and 8 mm right middle lobe lung nodules. Non-contrast chest CT at 3-6 months is recommended. If the nodules are stable at time of repeat CT, then future CT at 18-24 months (from today's scan) is considered optional for low-risk patients, but is recommended for high-risk patients. This recommendation follows the consensus statement: Guidelines for Management of Incidental Pulmonary Nodules Detected on CT Images: From the Fleischner Society 2017; Radiology 2017; 284:228-243. 2. Mild to moderate severity bilateral lower lobe atelectasis. 3. Mild bilateral hilar and subcarinal lymphadenopathy.  Will forward to Dr Cristal Deer for review

## 2023-07-10 ENCOUNTER — Telehealth (HOSPITAL_BASED_OUTPATIENT_CLINIC_OR_DEPARTMENT_OTHER): Payer: Self-pay | Admitting: Cardiology

## 2023-07-10 NOTE — Telephone Encounter (Signed)
Patient called to follow-up on test results. 

## 2023-07-10 NOTE — Telephone Encounter (Signed)
Patient calling to get results for Cardiac CT. Please advise.

## 2023-07-16 ENCOUNTER — Telehealth (HOSPITAL_BASED_OUTPATIENT_CLINIC_OR_DEPARTMENT_OTHER): Payer: Self-pay

## 2023-07-16 DIAGNOSIS — R911 Solitary pulmonary nodule: Secondary | ICD-10-CM

## 2023-07-16 MED ORDER — ROSUVASTATIN CALCIUM 10 MG PO TABS: 10.0000 mg | ORAL_TABLET | Freq: Every day | ORAL | 1 refills | Status: DC

## 2023-07-16 NOTE — Telephone Encounter (Signed)
-----   Message from Alver Sorrow sent at 07/16/2023  7:42 AM EST ----- Cardiac CTA with coronary calcium score 9.33.  Overall minimal nonobstructive coronary artery disease.  Recommend secondary prevention with Rosuvastatin 10mg  daily. Stop Pravastatin. Continue Aspirin.  Radiology report did reveal 10 mm and 8 mm right middle lobe lung nodules.  Recommend for noncontrast chest CT in 3 to 6 months to reassess.  There is also evidence of atelectasis which is concerning for infection such as pneumonia, if having respiratory symptoms recommend further follow up with PCP.

## 2023-07-16 NOTE — Telephone Encounter (Signed)
Called and spoke to patient; discussed echo results and cardiac CTA. Pt is in agreement with APP recommendations. Will send script to preferred pharmacy and place order for chest CT w/o contrast. He verbalized understanding and is to see pulmonologist next month.

## 2023-07-16 NOTE — Telephone Encounter (Signed)
Addressed via results. See testing for more details.   Alver Sorrow, NP

## 2023-07-20 DIAGNOSIS — G4733 Obstructive sleep apnea (adult) (pediatric): Secondary | ICD-10-CM | POA: Diagnosis not present

## 2023-08-07 ENCOUNTER — Encounter (INDEPENDENT_AMBULATORY_CARE_PROVIDER_SITE_OTHER): Payer: Self-pay | Admitting: Otolaryngology

## 2023-08-07 ENCOUNTER — Encounter: Payer: Self-pay | Admitting: Pulmonary Disease

## 2023-08-07 ENCOUNTER — Ambulatory Visit (INDEPENDENT_AMBULATORY_CARE_PROVIDER_SITE_OTHER): Payer: HMO | Admitting: Pulmonary Disease

## 2023-08-07 VITALS — BP 124/80 | HR 78 | Ht 68.5 in | Wt 211.2 lb

## 2023-08-07 DIAGNOSIS — Q318 Other congenital malformations of larynx: Secondary | ICD-10-CM

## 2023-08-07 DIAGNOSIS — R0609 Other forms of dyspnea: Secondary | ICD-10-CM | POA: Diagnosis not present

## 2023-08-07 NOTE — Progress Notes (Addendum)
 @Patient  ID: Mitchell Todd, male    DOB: 27-Nov-1955, 68 y.o.   MRN: [id number]  Chief Complaint  Patient presents with   Follow-up    OSA/Asthma F/U visit    Referring provider: No ref. provider found  HPI:   68 y.o. man whom we are seeing for evaluation of dyspnea on exertion.  Most recent sleep evaluation Quita Salt, MD reviewed.  Most recent cardiology note reviewed.  Returns for routine follow-up.  In interim since last visit vision has gotten worse or change.  Concern Trelegy is contributing.  Will stop Trelegy.  His vision did not get any better.  He went back on the Trelegy.  In the past he stated Trelegy has been helpful.  Now he said it is not very helpful.  Does not think it helps.  Still significant dyspnea.  Reviewed PFTs in detail.  Bronchodilator responsiveness of asthma.  However not all asthma responsive inhalers.  Also had cardiac evaluation.  CT coronary okay.  Demonstrated small nodules repeat scan ordered.  He is at the schedule he was encouraged to do so.  TTE revealed low normal EF and diastolic dysfunction on my review.  He feels airflow imitation higher up in his throat.  Not really deep in his chest.  Discussed possible structural issues.  ENT referral.  HPI at initial visit: Patient was in usual state of health.  Very active.  Works outside a lot.  Noted went snorkeling 2 years ago in the [place] he could not breathe well through the snorkel.  Very short of breath.  Has returned to the boat.  This has been repeated twice, trips in the last 2 years.  In addition to the initial visit.  Similar symptoms.  His most recent palpating over try.  In addition, over the last several months he has noted worsening dyspnea on exertion.  When carrying bags, heavy items around his property.  No time of day when things are better or worse.  No position to make things better or worse.  Rest makes it better.  Has used albuterol  without improvement.  No seasonal or environmental  factors he can identify to make things better or worse.  No other alleviating or exacerbating factors.  He reports having a smoking history, 60+ pack year.  Quit 25 years ago.  Denies any history of asthma.  Has had no chest imaging that I can see, reports none.  He did recently have labs, reviewed, no evidence of anemia.  No chest pain.  Not evaluated by cardiology in the past.  Questionaires / Pulmonary Flowsheets:   ACT:      No data to display          MMRC:     No data to display          Epworth:      No data to display          Tests:   FENO:  No results found for: NITRICOXIDE  PFT:    Latest Ref Rng & Units 10/30/2022    8:51 AM  PFT Results  FVC-Pre L 2.67   FVC-Predicted Pre % 63   FVC-Post L 3.23   FVC-Predicted Post % 76   Pre FEV1/FVC % % 78   Post FEV1/FCV % % 72   FEV1-Pre L 2.09   FEV1-Predicted Pre % 67   FEV1-Post L 2.32   DLCO uncorrected ml/min/mmHg 25.63   DLCO UNC% % 102   DLCO corrected ml/min/mmHg 25.63  DLCO COR %Predicted % 102   DLVA Predicted % 101   TLC L 6.51   TLC % Predicted % 96   RV % Predicted % 81   Personally reviewed interpreted as spirometry suggestive of moderate restriction versus air trapping.  Significant bronchodilator response in FVC.  Lung volumes within normal limits.  DLCO within normal limits.  WALK:      No data to display          Imaging: No results found.  Lab Results: Personally reviewed CBC    Component Value Date/Time   WBC 9.2 11/22/2020 0244   RBC 4.32 11/22/2020 0244   HGB 13.6 11/22/2020 0244   HCT 40.3 11/22/2020 0244   PLT 188 11/22/2020 0244   MCV 93.3 11/22/2020 0244   MCH 31.5 11/22/2020 0244   MCHC 33.7 11/22/2020 0244   RDW 12.4 11/22/2020 0244    BMET    Component Value Date/Time   NA 142 06/03/2023 1421   K 4.3 06/03/2023 1421   CL 101 06/03/2023 1421   CO2 22 06/03/2023 1421   GLUCOSE 115 (H) 06/03/2023 1421   GLUCOSE 123 (H) 11/22/2020 0244   BUN 14  06/03/2023 1421   CREATININE 1.19 06/03/2023 1421   CALCIUM  9.1 06/03/2023 1421   GFRNONAA 56 (L) 11/22/2020 0244    BNP No results found for: BNP  ProBNP No results found for: PROBNP  Specialty Problems       Pulmonary Problems   Dyspnea on exertion   PFT 10/30/22      OSA (obstructive sleep apnea)   HST 03/16/23-  AHI 12.4/hr, desaturation to 74%, body weight 202 lbs       Allergies  Allergen Reactions   Penicillins     Childhood allergy     There is no immunization history on file for this patient.  Past Medical History:  Diagnosis Date   Arthritis    knees oa   Concussion 04/2018   no residual from   DOE (dyspnea on exertion)    Eczema    Elevated cholesterol    GERD (gastroesophageal reflux disease)    Gout    Hypertension    Sleep apnea    ues cpap set on 7  uses 2 oe 3 night s per week   Urinary frequency    Wears glasses     Tobacco History: Social History   Tobacco Use  Smoking Status Former  Smokeless Tobacco Never   Counseling given: Not Answered   Continue to not smoke  Outpatient Encounter Medications as of 08/07/2023  Medication Sig   albuterol  (VENTOLIN  HFA) 108 (90 Base) MCG/ACT inhaler Inhale 2 puffs into the lungs every 6 (six) hours as needed for wheezing or shortness of breath.   allopurinol  (ZYLOPRIM ) 300 MG tablet Take 300 mg by mouth daily.   amLODipine  (NORVASC ) 5 MG tablet Take 5 mg by mouth daily.   aspirin EC 81 MG tablet Take 81 mg by mouth daily. Swallow whole.   Cranberry-Vitamin C-Probiotic (AZO CRANBERRY PO) Take 500 mg by mouth daily.   famotidine  (PEPCID ) 40 MG tablet Take 40 mg by mouth at bedtime.   finasteride (PROSCAR) 5 MG tablet Take 5 mg by mouth daily.   hydrocortisone cream 1 % Apply 1 application  topically as needed for itching.   Loratadine  10 MG CAPS Take 10 mg by mouth daily.   Nutritional Supplements (GLUCOSAMINE COMPLEX PO) Take by mouth. With msm 1 tab daily   Pantoprazole Sodium (PROTONIX  PO) Take 40 mg by mouth daily.   rosuvastatin  (CRESTOR ) 10 MG tablet Take 1 tablet (10 mg total) by mouth daily.   tadalafil (CIALIS) 5 MG tablet Take 5 mg by mouth daily.   telmisartan (MICARDIS) 80 MG tablet Take 80 mg by mouth daily.   Testosterone  10 MG/ACT (2%) GEL Place 1 mL onto the skin daily.   UNABLE TO FIND Take 10 mg by mouth at bedtime. Med Name: CBD Gummies   No facility-administered encounter medications on file as of 08/07/2023.     Review of Systems  Review of Systems  N/a  Physical Exam  BP 124/80 (BP Location: Left Arm, Patient Position: Sitting, Cuff Size: Large)   Pulse 78   Ht 5' 8.5 (1.74 m)   Wt 211 lb 3.2 oz (95.8 kg)   SpO2 95%   BMI 31.65 kg/m   Wt Readings from Last 5 Encounters:  08/07/23 211 lb 3.2 oz (95.8 kg)  05/27/23 212 lb 12.8 oz (96.5 kg)  05/23/23 208 lb 8 oz (94.6 kg)  05/08/23 208 lb (94.3 kg)  02/22/23 211 lb (95.7 kg)    BMI Readings from Last 5 Encounters:  08/07/23 31.65 kg/m  05/27/23 31.89 kg/m  05/23/23 31.24 kg/m  05/08/23 31.17 kg/m  02/22/23 31.62 kg/m     Physical Exam General: Sitting in chair, no acute distress Eyes: EOMI, icterus Neck: Supple, JVP Pulmonary: Clear, no work of breathing Abdomen: Nondistended, bowel sounds present Cardiovascular: Warm, no edema MSK: No synovitis, no joint effusion Neuro: Normal gait, no weakness Psych: Normal mood, full affect   Assessment & Plan:   Dyspnea on exertion: First noted a couple years ago with snorkeling which has been persistent and now progressive to activities around his property.  He has an approximate 60+ pack year smoking history.  Is quite possible that this is the primary contributor symptoms.  Possible development of asthma.  Possible cardiac contributors as well.  Albuterol  has not helped much.  Chest x-ray clear.  PFTs demonstrate significant bronchodilator response.  Symptoms improved with Trelegy stopping for now given concern for eye changes.  He  is since resume Trelegy given no changes or improvement in eye symptoms.  But now he does not think it helps much.  Cardiac evaluation in the meantime reveals low normal EF and diastolic dysfunction, likely this is contributing somewhat with normal physiology worsening and diastology.  Not much to do to help with this.  He mentions he feels like the blockage or difficulty is higher in his throat.  Referral to ENT to evaluate for structural changes, vocal cord dysfunction.  Trial of Stiolto via samples today.  Nocturnal cough: Reflux versus etiologies discussed above.  Seems to be less of an issue since taking Trelegy.  No longer taking Trelegy.  OSA on CPAP: Report reviewed today.  Good adherence.  Benefiting clinically and encouraged to continue.  AHI 2.6 on current settings.    Return in about 3 months (around 11/05/2023) for f/u Dr. Annella, after CT scan.   Donnice JONELLE Annella, MD 08/07/2023   I spent 41 minutes in the care of the patient including patient this visit, review of records, coordination of care.

## 2023-08-07 NOTE — Patient Instructions (Signed)
 Nice to see you again  Am sorry the Trelegy does not seem to be helping anymore  Lets try Stiolto 2 puffs once a day.  See me a message if it helps.  Each sample inhaler will last 2 weeks  I sent a referral to the ENT doctor look at the back of the throat given that seems to be the area that is most bothersome or may be contributing the most to your symptoms  Will follow-up after the CT scan performed in March and see if there is anything else or different we need to discuss based on the results  Return to clinic in 3 months or sooner as needed with Dr. Annella

## 2023-09-03 ENCOUNTER — Ambulatory Visit: Payer: No Typology Code available for payment source | Admitting: Internal Medicine

## 2023-09-09 ENCOUNTER — Ambulatory Visit (INDEPENDENT_AMBULATORY_CARE_PROVIDER_SITE_OTHER): Payer: HMO | Admitting: Otolaryngology

## 2023-09-09 ENCOUNTER — Encounter (INDEPENDENT_AMBULATORY_CARE_PROVIDER_SITE_OTHER): Payer: Self-pay | Admitting: Otolaryngology

## 2023-09-09 VITALS — BP 170/90 | HR 101 | Ht 69.0 in | Wt 205.0 lb

## 2023-09-09 DIAGNOSIS — R0989 Other specified symptoms and signs involving the circulatory and respiratory systems: Secondary | ICD-10-CM | POA: Diagnosis not present

## 2023-09-09 DIAGNOSIS — R0609 Other forms of dyspnea: Secondary | ICD-10-CM

## 2023-09-09 DIAGNOSIS — J383 Other diseases of vocal cords: Secondary | ICD-10-CM | POA: Diagnosis not present

## 2023-09-09 DIAGNOSIS — K219 Gastro-esophageal reflux disease without esophagitis: Secondary | ICD-10-CM | POA: Diagnosis not present

## 2023-09-09 NOTE — Progress Notes (Signed)
 ENT CONSULT:  Reason for Consult: dyspnea and throat tightness with exertion   HPI: Discussed the use of AI scribe software for clinical note transcription with the patient, who gave verbal consent to proceed.  History of Present Illness   Mitchell Todd is a 68 year old male who presents with symptoms of upper airway obstruction/throat tightness and dyspnea on exertion.   He experiences a sensation of throat tightness and shortness of breath upon exertion, ongoing for the past couple of years. These symptoms were first noticed while snorkeling and have persisted, being triggered by physical activities such as carrying ceramic tiles over short distances, leading to gasping for breath and a sensation of throat constriction.  He has been evaluated by a pulmonary specialist and has tried multiple inhalers without relief. Initially thought to have asthma, inhalers did not improve his symptoms, leading to discontinuation. Lung imaging and pulmonary function tests have not indicated a lung disease as the cause of his symptoms.  He is under the care of a cardiologist and has undergone tests including a cardiac CT with contrast and an echocardiogram. Further cardiac evaluation is scheduled in March due to findings that require follow-up.  He has a history of sleep apnea and uses a CPAP machine nightly. He reports occasional wheezing with exertion, lasting for a minute or two, and denies any recent surgeries requiring intubation.  He experiences frequent throat clearing and a sensation of something being stuck in his throat. He takes a generic form of Pepcid twice daily for heartburn.  His family history includes a grandfather with throat cancer. He has a history of esophageal dilation due to swallowing difficulties many years ago. He is a former smoker, having quit 25 years ago.      Records Reviewed:  Office visit wit Dr Maple Hudson 05/27/23 Karren Burly, MD 11/26/2022 Dyspnea on exertion: First  noted a couple years ago with snorkeling which has been persistent and now progressive to activities around his property.  He has an approximate 60+ pack year smoking history.  Is quite possible that this is the primary contributor symptoms.  Possible development of asthma.  Possible cardiac contributors as well.  Albuterol has not helped much.  Chest x-ray clear.  PFTs demonstrate significant bronchodilator response.  Trelegy prescribed last visit, has helped.  New prescription today.  Co-pay card provided.  Samples also provided.   Nocturnal cough: Reflux versus etiologies discussed above.  Seems to be less of an issue since taking Trelegy.   02/22/23- 51 yoM former smoker for sleep evaluation courtesy of [name], who saw pt 11/26/22 for DOE as above. Hx of OSA Medical problem list includes HTN, CAD, GERD/ esophageal stricture, BPH, Gout,  CPAP Epworth score- Body weight today- ------Patient had sleep study 10-15 years ago. Has been on cpap machine for 15 years His machine is that old and is still working.  He has no contact with the homecare company he got it through originally and has been buying all supplies online.  We discussed replacement and I explained that we would need an updated sleep study since the original was long ago.  He thinks current pressure setting is 7.5 CWP.  He indicates he is sleeping okay.       He had seen [name] for dyspnea on exertion with follow-up appointment in a year.  Working diagnosis is asthma.  He had smoked heavily in the past but has not smoked in years.  He does wheeze occasionally.  He has  not noticed any changes using Trelegy.  He primarily notices dyspnea on exertion, a general sense of fatigue or weakness, and a lot of leg cramping suggestive of claudication.  He does not know personal history of cardiac disease and says he saw a cardiologist years ago with no follow-up.  His father experienced an MI at age 5. PFT showed response to bronchodilator  but not significant obstructive disease.  CXR was clear.   05/27/23- 67yoM former smoker (60+ pkyrs) followed for OSA, Asthma, complicated by HTN, CAD, GERD/ esophageal stricture, BPH, Gout,  Followed by Dr Judeth Horn for general pulmonary. HST 03/16/23-  AHI 12.4/hr, desaturation to 74%, body weight 202 lbs For treatment decision- Body weight today-212 lbs Trelegy affected eyesight and was discontinued pending opth f/u. Saw Cardiology for DOE, tests pending. Discussed results of sleep study which confirmed ongoing OSA.  We are ordering replacement of old CPAP. He will f/u with Dr Judeth Horn for dyspnea.  Pulm Dr Judeth Horn 08/07/23 68 y.o. man whom we are seeing for evaluation of dyspnea on exertion.  Most recent sleep evaluation [name], MD reviewed.  Most recent cardiology note reviewed.   Returns for routine follow-up.  In interim since last visit vision has gotten worse or change.  Concern Trelegy is contributing.  Will stop Trelegy.  His vision did not get any better.  He went back on the Trelegy.  In the past he stated Trelegy has been helpful.  Now he said it is not very helpful.  Does not think it helps.  Still significant dyspnea.  Reviewed PFTs in detail.  Bronchodilator responsiveness of asthma.  However not all asthma responsive inhalers.  Also had cardiac evaluation.  CT coronary okay.  Demonstrated small nodules repeat scan ordered.  He is at the schedule he was encouraged to do so.  TTE revealed low normal EF and diastolic dysfunction on my review.   He feels airflow imitation higher up in his throat.  Not really deep in his chest.  Discussed possible structural issues.  ENT referral.   HPI at initial visit: Patient was in usual state of health.  Very active.  Works outside a lot.  Noted went snorkeling 2 years ago in the [place] he could not breathe well through the snorkel.  Very short of breath.  Has returned to the boat.  This has been repeated twice, trips in the last 2  years.  In addition to the initial visit.  Similar symptoms.  His most recent palpating over try.  In addition, over the last several months he has noted worsening dyspnea on exertion.  When carrying bags, heavy items around his property.  No time of day when things are better or worse.  No position to make things better or worse.  Rest makes it better.  Has used albuterol without improvement.  No seasonal or environmental factors he can identify to make things better or worse.  No other alleviating or exacerbating factors.   He reports having a smoking history, 60+ pack year.  Quit 25 years ago.  Denies any history of asthma.   Has had no chest imaging that I can see, reports none.  He did recently have labs, reviewed, no evidence of anemia.  No chest pain.  Not evaluated by cardiology in the past.  Dyspnea on exertion: First noted a couple years ago with snorkeling which has been persistent and now progressive to activities around his property.  He has an approximate 60+ pack year smoking history.  Is  quite possible that this is the primary contributor symptoms.  Possible development of asthma.  Possible cardiac contributors as well.  Albuterol has not helped much.  Chest x-ray clear.  PFTs demonstrate significant bronchodilator response.  Symptoms improved with Trelegy stopping for now given concern for eye changes.  He is since resume Trelegy given no changes or improvement in eye symptoms.  But now he does not think it helps much.  Cardiac evaluation in the meantime reveals low normal EF and diastolic dysfunction, likely this is contributing somewhat with normal physiology worsening and diastology.  Not much to do to help with this.  He mentions he feels like the blockage or difficulty is higher in his throat.  Referral to ENT to evaluate for structural changes, vocal cord dysfunction.  Trial of Stiolto via samples today.   Nocturnal cough: Reflux versus etiologies discussed above.  Seems to be less of  an issue since taking Trelegy.  No longer taking Trelegy.   OSA on CPAP: Report reviewed today.  Good adherence.  Benefiting clinically and encouraged to continue.  AHI 2.6 on current settings.       Past Medical History:  Diagnosis Date   Arthritis    knees oa   Concussion 04/2018   no residual from   DOE (dyspnea on exertion)    Eczema    Elevated cholesterol    GERD (gastroesophageal reflux disease)    Gout    Hypertension    Sleep apnea    ues cpap set on 7  uses 2 oe 3 night s per week   Urinary frequency    Wears glasses     Past Surgical History:  Procedure Laterality Date   APPENDECTOMY  as child   foot cyst removed Right 2012   TONSILLECTOMY  as child   TRANSURETHRAL RESECTION OF PROSTATE N/A 11/21/2020   Procedure: TRANSURETHRAL RESECTION OF THE PROSTATE (TURP)/ BIPOLAR;  Surgeon: Jannifer Hick, MD;  Location: [facility];  Service: Urology;  Laterality: N/A;   trigger finger surgeyr  left thumb 2012    History reviewed. No pertinent family history.  Social History:  reports that he has quit smoking. He has never used smokeless tobacco. No history on file for alcohol use and drug use.  Allergies:  Allergies  Allergen Reactions   Penicillins     Childhood allergy    Medications: I have reviewed the patient's current medications.  The PMH, PSH, Medications, Allergies, and SH were reviewed and updated.  ROS: Constitutional: Negative for fever, weight loss and weight gain. Cardiovascular: Negative for chest pain and dyspnea on exertion. Respiratory: Is not experiencing shortness of breath at rest. Gastrointestinal: Negative for nausea and vomiting. Neurological: Negative for headaches. Psychiatric: The patient is not nervous/anxious  Blood pressure (!) 170/90, pulse (!) 101, height 5\' 9"  (1.753 m), weight 205 lb (93 kg), SpO2 93%.  PHYSICAL EXAM:  Exam: General: Well-developed, well-nourished Respiratory Respiratory effort: Equal  inspiration and expiration without stridor Cardiovascular Peripheral Vascular: Warm extremities with equal color/perfusion Eyes: No nystagmus with equal extraocular motion bilaterally Neuro/Psych/Balance: Patient oriented to person, place, and time; Appropriate mood and affect; Gait is intact with no imbalance; Cranial nerves I-XII are intact Head and Face Inspection: Normocephalic and atraumatic without mass or lesion Palpation: Facial skeleton intact without bony stepoffs Salivary Glands: No mass or tenderness Facial Strength: Facial motility symmetric and full bilaterally ENT Pinna: External ear intact and fully developed External canal: Canal is patent with intact skin Tympanic  Membrane: Clear and mobile External Nose: No scar or anatomic deformity Internal Nose: Septum is deviated to the left. No polyp, or purulence. Mucosal edema and erythema present.  Bilateral inferior turbinate hypertrophy.  Lips, Teeth, and gums: Mucosa and teeth intact and viable TMJ: No pain to palpation with full mobility Oral cavity/oropharynx: No erythema or exudate, no lesions present Nasopharynx: No mass or lesion with intact mucosa Hypopharynx: Intact mucosa without pooling of secretions Larynx Glottic: Full true vocal cord mobility without lesion or mass Supraglottic: Normal appearing epiglottis and AE folds Interarytenoid Space: Moderate pachydermia&edema Subglottic Space: Patent without lesion or edema Neck Neck and Trachea: Midline trachea without mass or lesion Thyroid: No mass or nodularity Lymphatics: No lymphadenopathy  Procedure: Preoperative diagnosis: dyspnea on exertion, throat tightness  Postoperative diagnosis:   Same + GERD LPR  Procedure: Flexible fiberoptic laryngoscopy  Surgeon: Ashok Croon, MD  Anesthesia: Topical lidocaine and Afrin Complications: None Condition is stable throughout exam  Indications and consent:  The patient presents to the clinic with Indirect  laryngoscopy view was incomplete. Thus it was recommended that they undergo a flexible fiberoptic laryngoscopy. All of the risks, benefits, and potential complications were reviewed with the patient preoperatively and verbal informed consent was obtained.  Procedure: The patient was seated upright in the clinic. Topical lidocaine and Afrin were applied to the nasal cavity. After adequate anesthesia had occurred, I then proceeded to pass the flexible telescope into the nasal cavity. The nasal cavity was patent without rhinorrhea or polyp. The nasopharynx was also patent without mass or lesion. The base of tongue was visualized and was normal. There were no signs of pooling of secretions in the piriform sinuses. The true vocal folds were mobile bilaterally. There were no signs of glottic or supraglottic mucosal lesion or mass. There was moderate interarytenoid pachydermia and post cricoid edema. The telescope was then slowly withdrawn and the patient tolerated the procedure throughout.   Studies Reviewed: 10/04/22 CXR IMPRESSION: 1. Low lung volumes without radiographic evidence of acute cardiopulmonary disease.  CTA Coronary  IMPRESSION: 1. 10 mm and 8 mm right middle lobe lung nodules. Non-contrast chest CT at 3-6 months is recommended. If the nodules are stable at time of repeat CT, then future CT at 18-24 months (from today's scan) is considered optional for low-risk patients, but is recommended for high-risk patients. This recommendation follows the consensus statement: Guidelines for Management of Incidental Pulmonary Nodules Detected on CT Images: From the Fleischner Society 2017; Radiology 2017; 284:228-243. 2. Mild to moderate severity bilateral lower lobe atelectasis. 3. Mild bilateral hilar and subcarinal lymphadenopathy.  CT chest pending (scheduled for 10/14/23)  PFT 10/30/22 spirometry suggestive of moderate restriction versus air trapping. Significant bronchodilator response in  FVC. Lung volumes within normal limits. DLCO within normal limits.   Assessment/Plan: Encounter Diagnoses  Name Primary?   Vocal fold dysfunction Yes   Dyspnea on exertion    Chronic GERD    Throat tightness     Assessment and Plan    Exertional Dyspnea and throat tightness  Reports throat tightness and dyspnea on exertion for over a year. Asthma evaluation and multiple inhalers provided no relief. Pulmonary function tests and CXR performed, with no acute abnormalities on chest x-ray and PFTs with evidence of air trapping and significant bronchodilator response in FVC. Currently undergoing cardiac evaluation, including cardiac CTA and echocardiogram. ENT evaluation including flexible scope exam today revealed no upper airway obstruction, no masses or lesions. There was no subglottic narrowing. Vocal cord dysfunction  can present with similar symptoms and can be managed with voice therapy and rescue breathing techniques. Reflux may cause laryngospasm. He also had abnormal findings on cardiac CTA of the chest and has a pending CT chest (scheduled for 10/14/23).  We discussed that he needs to complete cardiac and pulmonary workup to exclude other causes of dyspnea on exertion.  If all workup is negative vocal cord dysfunction could be a potential cause of his symptoms.  His scope exam did not demonstrate clear adduction of the vocal cords on inspiration even with provoking maneuvers, but if no other cause has been identified, this could potentially explain some of the symptoms - Continue cardiac and pulmonary workup including CT chest scheduled in March. - Refer to speech therapy for vocal cord dysfunction evaluation and potential treatment. - Recommend rescue breathing techniques if vocal cord dysfunction is confirmed.  Chronic Gastroesophageal Reflux Disease (GERD) Currently on generic Pepcid twice daily. 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 Symptoms include throat irritation and  globus sensation. Reflux can cause throat irritation and globus sensation. Recommended a seaweed-based supplement for throat irritation and reflux symptoms. - Continue generic Pepcid 20 mg twice daily. - Recommend reflux gourmet post-meals for throat irritation and reflux symptoms.  General Health Maintenance Former smoker, quit 25 years ago. Uses CPAP nightly for sleep apnea. - Continue CPAP nightly for sleep apnea. -Consider trial of Flonase 2 puffs bilateral nares twice daily for nasal congestion  Follow-up - Follow up with cardiology in March and see pulmonary as scheduled - Follow up with ENT as needed based on symptom changes or specialist recommendations.       Ashok Croon, MD Otolaryngology [facility] Phone: [phone] Fax: [phone]    09/10/2023, 5:42 PM

## 2023-09-20 DIAGNOSIS — I10 Essential (primary) hypertension: Secondary | ICD-10-CM | POA: Diagnosis not present

## 2023-09-20 DIAGNOSIS — Z23 Encounter for immunization: Secondary | ICD-10-CM | POA: Diagnosis not present

## 2023-09-20 DIAGNOSIS — R0602 Shortness of breath: Secondary | ICD-10-CM | POA: Diagnosis not present

## 2023-09-20 DIAGNOSIS — R7303 Prediabetes: Secondary | ICD-10-CM | POA: Diagnosis not present

## 2023-09-20 DIAGNOSIS — Z0000 Encounter for general adult medical examination without abnormal findings: Secondary | ICD-10-CM | POA: Diagnosis not present

## 2023-09-20 DIAGNOSIS — N1831 Chronic kidney disease, stage 3a: Secondary | ICD-10-CM | POA: Diagnosis not present

## 2023-09-20 DIAGNOSIS — M109 Gout, unspecified: Secondary | ICD-10-CM | POA: Diagnosis not present

## 2023-09-20 DIAGNOSIS — E7800 Pure hypercholesterolemia, unspecified: Secondary | ICD-10-CM | POA: Diagnosis not present

## 2023-09-20 DIAGNOSIS — N400 Enlarged prostate without lower urinary tract symptoms: Secondary | ICD-10-CM | POA: Diagnosis not present

## 2023-09-20 DIAGNOSIS — G4733 Obstructive sleep apnea (adult) (pediatric): Secondary | ICD-10-CM | POA: Diagnosis not present

## 2023-09-20 DIAGNOSIS — E291 Testicular hypofunction: Secondary | ICD-10-CM | POA: Diagnosis not present

## 2023-09-20 DIAGNOSIS — N529 Male erectile dysfunction, unspecified: Secondary | ICD-10-CM | POA: Diagnosis not present

## 2023-09-23 DIAGNOSIS — D0462 Carcinoma in situ of skin of left upper limb, including shoulder: Secondary | ICD-10-CM | POA: Diagnosis not present

## 2023-09-23 DIAGNOSIS — D2261 Melanocytic nevi of right upper limb, including shoulder: Secondary | ICD-10-CM | POA: Diagnosis not present

## 2023-09-23 DIAGNOSIS — Z85828 Personal history of other malignant neoplasm of skin: Secondary | ICD-10-CM | POA: Diagnosis not present

## 2023-09-23 DIAGNOSIS — L905 Scar conditions and fibrosis of skin: Secondary | ICD-10-CM | POA: Diagnosis not present

## 2023-09-23 DIAGNOSIS — D225 Melanocytic nevi of trunk: Secondary | ICD-10-CM | POA: Diagnosis not present

## 2023-09-23 DIAGNOSIS — D2272 Melanocytic nevi of left lower limb, including hip: Secondary | ICD-10-CM | POA: Diagnosis not present

## 2023-09-23 DIAGNOSIS — L821 Other seborrheic keratosis: Secondary | ICD-10-CM | POA: Diagnosis not present

## 2023-09-23 DIAGNOSIS — D2271 Melanocytic nevi of right lower limb, including hip: Secondary | ICD-10-CM | POA: Diagnosis not present

## 2023-09-23 DIAGNOSIS — D485 Neoplasm of uncertain behavior of skin: Secondary | ICD-10-CM | POA: Diagnosis not present

## 2023-09-23 DIAGNOSIS — Z85820 Personal history of malignant melanoma of skin: Secondary | ICD-10-CM | POA: Diagnosis not present

## 2023-09-23 DIAGNOSIS — D2262 Melanocytic nevi of left upper limb, including shoulder: Secondary | ICD-10-CM | POA: Diagnosis not present

## 2023-10-02 ENCOUNTER — Ambulatory Visit: Payer: HMO | Attending: Otolaryngology | Admitting: Speech Pathology

## 2023-10-02 ENCOUNTER — Other Ambulatory Visit: Payer: Self-pay

## 2023-10-02 ENCOUNTER — Encounter: Payer: Self-pay | Admitting: Speech Pathology

## 2023-10-02 DIAGNOSIS — J383 Other diseases of vocal cords: Secondary | ICD-10-CM | POA: Insufficient documentation

## 2023-10-02 DIAGNOSIS — R498 Other voice and resonance disorders: Secondary | ICD-10-CM | POA: Insufficient documentation

## 2023-10-02 NOTE — Patient Instructions (Addendum)
   Reflux Gourmet blocks LPR - take after each meal and before bed - [employer]  The reflux meds you are on get rid of the acid but don't stop the reflux  When you cough or can't breath try the relaxed sniff blow with a audible Sh   Use this breathing before you start an activity that you know is a trigger  Continue the breathing throughout the activity   Coughing and throat clearing are part of the VCD as well - when you feel a tickle or lump  Don't talk until the sensation to cough, tickle, or lump has passed  Keep neck and shoulder relaxed when you do this, ideally breathe with your diaphragm

## 2023-10-02 NOTE — Therapy (Signed)
 OUTPATIENT SPEECH LANGUAGE PATHOLOGY VOICE EVALUATION   Patient Name: Mitchell Todd MRN: [id number] DOB:1956-03-27, 68 y.o., male Today's Date: 10/02/2023  PCP: Ceasar Lund, PA REFERRING PROVIDER: Ashok Croon MD  END OF SESSION:  End of Session - 10/02/23 1200     Visit Number 1    Number of Visits 17    Date for SLP Re-Evaluation 11/27/23    SLP Start Time 0930    SLP Stop Time  1015    SLP Time Calculation (min) 45 min    Activity Tolerance Patient tolerated treatment well             Past Medical History:  Diagnosis Date   Arthritis    knees oa   Concussion 04/2018   no residual from   DOE (dyspnea on exertion)    Eczema    Elevated cholesterol    GERD (gastroesophageal reflux disease)    Gout    Hypertension    Sleep apnea    ues cpap set on 7  uses 2 oe 3 night s per week   Urinary frequency    Wears glasses    Past Surgical History:  Procedure Laterality Date   APPENDECTOMY  as child   foot cyst removed Right 2012   TONSILLECTOMY  as child   TRANSURETHRAL RESECTION OF PROSTATE N/A 11/21/2020   Procedure: TRANSURETHRAL RESECTION OF THE PROSTATE (TURP)/ BIPOLAR;  Surgeon: Jannifer Hick, MD;  Location: [facility];  Service: Urology;  Laterality: N/A;   trigger finger surgeyr  left thumb 2012   Patient Active Problem List   Diagnosis Date Noted   Dyspnea on exertion 02/22/2023   OSA (obstructive sleep apnea)    BPH (benign prostatic hyperplasia) 11/21/2020   CAD 11/14/2007   ESOPHAGEAL STRICTURE 11/14/2007   GERD 11/14/2007    Onset date: 09/09/2023 (referral date) - onset 2-3 years ago  REFERRING DIAG: 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    He experiences a sensation of throat tightness and shortness of breath upon exertion, ongoing for the past couple of years. These symptoms were first noticed while snorkeling and have persisted, being triggered by physical activities such as carrying ceramic tiles over short distances, leading to gasping for breath and a sensation of throat constriction.   He has been evaluated by a pulmonary specialist and has tried multiple inhalers without relief   PAIN:  Are you having pain? No  FALLS: Has patient fallen in last 6 months? No, Number of falls: 0  LIVING ENVIRONMENT: Lives with: lives with their spouse Lives in: House/apartment  PLOF:Level of assistance: Independent with ADLs, Independent with IADLs Employment: Full-time employment  PATIENT GOALS: "to be able to breathe"  OBJECTIVE:  Note: Objective measures were completed at Evaluation unless otherwise noted.  DIAGNOSTIC FINDINGS: The nasopharynx was also patent without mass or lesion. The base of tongue was visualized and was normal. There were no signs of pooling of secretions in the piriform sinuses. The true vocal folds were mobile bilaterally. There were no signs of glottic or supraglottic mucosal lesion or mass. There was moderate interarytenoid pachydermia and post  cricoid edema. The telescope was then slowly withdrawn and the patient tolerated the procedure throughout.  Chronic Gastroesophageal Reflux Disease (GERD) Currently on generic Pepcid twice daily. 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 Symptoms include throat irritation and globus sensation. Reflux can cause throat irritation and globus sensation. Recommended a seaweed-based supplement for throat irritation and reflux symptoms. - Continue generic Pepcid 20 mg twice daily. - Recommend reflux gourmet post-meals for throat irritation and reflux  symptoms.  COGNITION: Overall cognitive status: Within functional limits for tasks assessed Areas of impairment:   Functional deficits:   SOCIAL HISTORY: Occupation: Lays tile, Agricultural engineer intake: suboptimal Caffeine/alcohol intake: moderate Daily voice use: moderate  PERCEPTUAL VOICE ASSESSMENT: Voice quality: hoarse and low vocal intensity Vocal abuse: habitual throat clearing and abnormal breathing pattern Resonance: normal Respiratory function: thoracic breathing  OBJECTIVE VOICE ASSESSMENT: Maximum phonation time for sustained "ah": WNL Conversational loudness range: 65 dB Laryngeal obstruction sx: cough, can't breathe, throat clears Triggers: exercise, bending over , LPR- denies sx with scents  PATIENT REPORTED OUTCOME MEASURES (PROM): Vocal Cord Dysfunction Questionnaire (VCD-Q) 47 - he rated a 5, or strongly agree for sx being in upper throat, feeling like he can't get a breath in, and worse sx breathing in. He rated a 4 or agree for sx coming on suddenly, feeling something in his throat he can't clear, noisy breathing, specific triggers and being frustrated his sx haven't been understood.   Reflux Severity Index RSI: 22 (10-13 is normal)                                                                                                                              TREATMENT DATE:   10/02/23 (eval day): Initiated training in suppression strategies for VCD/ILO (inspiratory laryngeal obstruction) After initial modeling and vocal cues to keep his shoulders down during inhalation as well as keep throat and neck relaxed, Mitchell Todd demonstrated accurate relaxed abdominal sniff-blow with audible blow. 10/10x and through out the remainder of the session. Instructed him to use this in conjunction with intermitted swallows or sips when he has an attack can't can't breathe as well as when he is coughing to reduce cough episode. Instructed him to use abdominal sniff-blow to pre-treat sx  when he know he will trigger this (ex: lifting and walking with tile bags or exercising or bending over). He endorses globus sensation which he relieves with throat clearing. Trained in use of throat clear suppression strategies including swallow, sip and sniff blow until sensation has passed   PATIENT EDUCATION: Education details: See Treatment, See Patient Instructions, strategies to manage VCD, vocal hygiene, reflux precautions,  Person educated: Patient Education method: Explanation, Demonstration, Verbal cues, and Handouts Education comprehension: verbalized understanding, verbal cues required, and needs further education  HOME EXERCISE PROGRAM: Abdominal sniff- blow with slightly audible blow to treat and prevent VCD sx  GOALS: Goals reviewed with patient? Yes   LONG TERM GOALS: Target date: 11/27/23  Pt will carryover strategies to suppress/eliminate VCD symptoms with occasional min A Baseline:  Goal status: INITIAL  2.  Pt will use throat clear suppression strategies 4/5 opportunities with rare min A Baseline:  Goal status: INITIAL  3.  Pt will report 50% reduction in VCD sx (subjectively) over 1 week Baseline:  Goal status: INITIAL  4.  Pt will verbalize 3 reflux precautions with mod I Baseline:  Goal status: INITIAL   ASSESSMENT:  CLINICAL IMPRESSION: Mitchell Todd is a 68 y.o. male who was seen today for vocal cord dysfunction (VCD). He reports frequent episodes where he cannot inhale, frequent coughing episodes as well. He endorses clearing his throat when he feels a lump in his throat. He IDs triggers of exercise and bending over. ENT scope revealed LPR as well. He is taking reflux meds am and pm as recommended by ENT, but has not started alginate. Mitchell Todd endorses frustration and some anxiety when he has VCD attacks. He is attempting to breathe through the attacks with some success. I recommend skilled ST to minimize VCD attacks for improved safety and QOL.    OBJECTIVE IMPAIRMENTS: include voice disorder. These impairments are limiting patient from effectively communicating at home and in community and safety when swallowing. Factors affecting potential to achieve goals and functional outcome are  n/a .Marland Kitchen Patient will benefit from skilled SLP services to address above impairments and improve overall function.  REHAB POTENTIAL: Good  PLAN:  SLP FREQUENCY: 2x/week  SLP DURATION: 8 weeks  PLANNED INTERVENTIONS: Environmental controls, Functional tasks, SLP instruction and feedback, Compensatory strategies, Patient/family education, and [address] Treatment of speech (30 or 45 min)     Mitchell Todd, Radene Journey, CCC-SLP 10/02/2023, 12:11 PM

## 2023-10-14 ENCOUNTER — Ambulatory Visit
Admission: RE | Admit: 2023-10-14 | Discharge: 2023-10-14 | Disposition: A | Discharge status: Home / Self Care | Payer: HMO | Source: Ambulatory Visit | Attending: Family | Admitting: Family

## 2023-10-14 DIAGNOSIS — R918 Other nonspecific abnormal finding of lung field: Secondary | ICD-10-CM | POA: Diagnosis not present

## 2023-10-14 DIAGNOSIS — R911 Solitary pulmonary nodule: Secondary | ICD-10-CM | POA: Diagnosis not present

## 2023-10-14 DIAGNOSIS — R590 Localized enlarged lymph nodes: Secondary | ICD-10-CM | POA: Diagnosis not present

## 2023-10-14 DIAGNOSIS — J9811 Atelectasis: Secondary | ICD-10-CM | POA: Diagnosis not present

## 2023-10-14 NOTE — Therapy (Unsigned)
 OUTPATIENT SPEECH LANGUAGE PATHOLOGY VOICE TREATMENT   Patient Name: Mitchell Todd MRN: [id number] DOB:13-Dec-1955, 68 y.o., male Today's Date: 10/15/2023  PCP: Mitchell Lund, PA REFERRING PROVIDER: Ashok Croon MD  END OF SESSION:  End of Session - 10/15/23 0756     Visit Number 2    Number of Visits 17    Date for SLP Re-Evaluation 11/27/23    Authorization Type HTA    SLP Start Time 0800    SLP Stop Time  0845    SLP Time Calculation (min) 45 min    Activity Tolerance Patient tolerated treatment well              Past Medical History:  Diagnosis Date   Arthritis    knees oa   Concussion 04/2018   no residual from   DOE (dyspnea on exertion)    Eczema    Elevated cholesterol    GERD (gastroesophageal reflux disease)    Gout    Hypertension    Sleep apnea    ues cpap set on 7  uses 2 oe 3 night s per week   Urinary frequency    Wears glasses    Past Surgical History:  Procedure Laterality Date   APPENDECTOMY  as child   foot cyst removed Right 2012   TONSILLECTOMY  as child   TRANSURETHRAL RESECTION OF PROSTATE N/A 11/21/2020   Procedure: TRANSURETHRAL RESECTION OF THE PROSTATE (TURP)/ BIPOLAR;  Surgeon: Mitchell Hick, MD;  Location: [facility];  Service: Urology;  Laterality: N/A;   trigger finger surgeyr  left thumb 2012   Patient Active Problem List   Diagnosis Date Noted   Dyspnea on exertion 02/22/2023   OSA (obstructive sleep apnea)    BPH (benign prostatic hyperplasia) 11/21/2020   CAD 11/14/2007   ESOPHAGEAL STRICTURE 11/14/2007   GERD 11/14/2007    Onset date: 09/09/2023 (referral date) - onset 2-3 years ago  REFERRING DIAG: 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    He experiences a sensation of throat tightness and shortness of breath upon exertion, ongoing for the past couple of years. These symptoms were first noticed while snorkeling and have persisted, being triggered by physical activities such as carrying ceramic tiles over short distances, leading to gasping for breath and a sensation of throat constriction.   He has been evaluated by a pulmonary specialist and has tried multiple inhalers without relief   PAIN:  Are you having pain? No  FALLS: Has patient fallen in last 6 months? No, Number of falls: 0  LIVING ENVIRONMENT: Lives with: lives with their spouse Lives in: House/apartment  PLOF:Level of assistance: Independent with ADLs, Independent with IADLs Employment: Full-time employment  PATIENT GOALS: "to be able to breathe"  OBJECTIVE:  Note: Objective measures were completed at Evaluation unless otherwise noted.  DIAGNOSTIC FINDINGS: The nasopharynx was also patent without mass or lesion. The base of tongue was visualized and was normal. There were no signs of pooling of secretions in the piriform sinuses. The true vocal folds were mobile bilaterally. There were no signs of glottic or supraglottic mucosal lesion or mass. There was moderate interarytenoid pachydermia and  post cricoid edema. The telescope was then slowly withdrawn and the patient tolerated the procedure throughout.  Chronic Gastroesophageal Reflux Disease (GERD) Currently on generic Pepcid twice daily. 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 Symptoms include throat irritation and globus sensation. Reflux can cause throat irritation and globus sensation. Recommended a seaweed-based supplement for throat irritation and reflux symptoms. - Continue generic Pepcid 20 mg twice daily. - Recommend reflux gourmet post-meals for throat  irritation and reflux symptoms.  SOCIAL HISTORY: Occupation: Dietitian, Agricultural engineer intake: suboptimal Caffeine/alcohol intake: moderate Daily voice use: moderate  PERCEPTUAL VOICE ASSESSMENT: Voice quality: hoarse and low vocal intensity Vocal abuse: habitual throat clearing and abnormal breathing pattern Resonance: normal Respiratory function: thoracic breathing  OBJECTIVE VOICE ASSESSMENT: Maximum phonation time for sustained "ah": WNL Conversational loudness range: 65 dB Laryngeal obstruction sx: cough, can't breathe, throat clears Triggers: exercise, bending over , LPR- denies sx with scents  PATIENT REPORTED OUTCOME MEASURES (PROM): Vocal Cord Dysfunction Questionnaire (VCD-Q) 47 - he rated a 5, or strongly agree for sx being in upper throat, feeling like he can't get a breath in, and worse sx breathing in. He rated a 4 or agree for sx coming on suddenly, feeling something in his throat he can't clear, noisy breathing, specific triggers and being frustrated his sx haven't been understood.   Reflux Severity Index RSI: 22 (10-13 is normal)                                                                                                 TREATMENT DATE:  10/15/23: Endorsed benefit of cough suppression techniques since evaluation. Reported increased coughing in the morning after doffing CPAP, bending over to tie shoes, and exercising - briskly walking and picking up items. Targeted carryover of cough suppression techniques in triggering situations today, with pt able to demonstrate strategies given rare min A for cough x1 and occasional min A for throat clear alternatives. Provided recommendations on patient instructions.   10/02/23 (eval day): Initiated training in suppression strategies for VCD/ILO (inspiratory laryngeal obstruction). After initial modeling and vocal cues to keep his shoulders down during inhalation as well as keep throat and neck relaxed, [name] demonstrated accurate  relaxed abdominal sniff-blow with audible blow. 10/10x and through out the remainder of the session. Instructed him to use this in conjunction with intermitted swallows or sips when he has an attack can't can't breathe as well as when he is coughing to reduce cough episode. Instructed him to use abdominal sniff-blow to pre-treat sx when he know he will trigger this (ex: lifting and walking with tile bags or exercising or bending over). He endorses globus sensation which he relieves with throat clearing. Trained in use of throat clear suppression strategies including swallow, sip and sniff blow until sensation has passed   PATIENT EDUCATION: Education details: See Treatment, See Patient Instructions, strategies to manage VCD, vocal hygiene, reflux precautions,  Person educated: Patient Education method: Explanation, Demonstration, Verbal cues, and Handouts Education comprehension: verbalized understanding, verbal cues required, and needs further education  HOME EXERCISE PROGRAM: Abdominal sniff- blow with slightly audible blow  to treat and prevent VCD sx  GOALS: Goals reviewed with patient? Yes   LONG TERM GOALS: Target date: 11/27/23  Pt will carryover strategies to suppress/eliminate VCD symptoms with occasional min A Baseline:  Goal status: IN PROGRESS  2.  Pt will use throat clear suppression strategies 4/5 opportunities with rare min A Baseline:  Goal status: IN PROGRESS  3.  Pt will report 50% reduction in VCD sx (subjectively) over 1 week Baseline:  Goal status: IN PROGRESS  4.  Pt will verbalize 3 reflux precautions with mod I Baseline:  Goal status: IN PROGRESS   ASSESSMENT:  CLINICAL IMPRESSION: Ihsan "Takao Lizer is a 68 y.o. male who was seen today for vocal cord dysfunction (VCD). Continued education and instruction of VCD strategies for cough suppression and throat clear alternatives with rare to occasional min A provided. I recommend skilled ST to minimize VCD  attacks for improved safety and QOL.   OBJECTIVE IMPAIRMENTS: include voice disorder. These impairments are limiting patient from effectively communicating at home and in community and safety when swallowing. Factors affecting potential to achieve goals and functional outcome are  n/a .Marland Kitchen Patient will benefit from skilled SLP services to address above impairments and improve overall function.  REHAB POTENTIAL: Good  PLAN:  SLP FREQUENCY: 2x/week  SLP DURATION: 8 weeks  PLANNED INTERVENTIONS: Environmental controls, Functional tasks, SLP instruction and feedback, Compensatory strategies, Patient/family education, and 40981 Treatment of speech (30 or 45 min)     Gracy Racer, CCC-SLP 10/15/2023, 7:56 AM

## 2023-10-15 ENCOUNTER — Ambulatory Visit

## 2023-10-15 DIAGNOSIS — R498 Other voice and resonance disorders: Secondary | ICD-10-CM | POA: Diagnosis not present

## 2023-10-15 NOTE — Patient Instructions (Addendum)
 Homework: Use breathing techniques before, during, and after situations that trigger the cough  Focus on your breathing while tying your shoes and walking/talking Really try not to clear your throat - pretend we are watching you! Sip(s) of water, hard swallow(s), or gentle throat clear and hard swallow

## 2023-10-18 ENCOUNTER — Ambulatory Visit: Admitting: Speech Pathology

## 2023-10-18 DIAGNOSIS — R498 Other voice and resonance disorders: Secondary | ICD-10-CM | POA: Diagnosis not present

## 2023-10-18 NOTE — Therapy (Signed)
 OUTPATIENT SPEECH LANGUAGE PATHOLOGY VOICE TREATMENT   Patient Name: Mitchell Todd MRN: [id number] DOB:[birth year], 68 y.o., male Today's Date: 10/18/2023  PCP: Ceasar Lund, PA REFERRING PROVIDER: Ashok Croon MD  END OF SESSION:  End of Session - 10/18/23 0759     Visit Number 3    Number of Visits 17    Date for SLP Re-Evaluation 11/27/23    Authorization Type HTA    SLP Start Time 0800    SLP Stop Time  0845    SLP Time Calculation (min) 45 min    Activity Tolerance Patient tolerated treatment well               Past Medical History:  Diagnosis Date   Arthritis    knees oa   Concussion 04/2018   no residual from   DOE (dyspnea on exertion)    Eczema    Elevated cholesterol    GERD (gastroesophageal reflux disease)    Gout    Hypertension    Sleep apnea    ues cpap set on 7  uses 2 oe 3 night s per week   Urinary frequency    Wears glasses    Past Surgical History:  Procedure Laterality Date   APPENDECTOMY  as child   foot cyst removed Right 2012   TONSILLECTOMY  as child   TRANSURETHRAL RESECTION OF PROSTATE N/A 11/21/2020   Procedure: TRANSURETHRAL RESECTION OF THE PROSTATE (TURP)/ BIPOLAR;  Surgeon: Jannifer Hick, MD;  Location: [facility];  Service: Urology;  Laterality: N/A;   trigger finger surgeyr  left thumb 2012   Patient Active Problem List   Diagnosis Date Noted   Dyspnea on exertion 02/22/2023   OSA (obstructive sleep apnea)    BPH (benign prostatic hyperplasia) 11/21/2020   CAD 11/14/2007   ESOPHAGEAL STRICTURE 11/14/2007   GERD 11/14/2007    Onset date: 09/09/2023 (referral date) - onset 2-3 years ago  REFERRING DIAG: 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 Pt  accompanied by: self  PERTINENT HISTORY: Mitchell Todd is a 68 year old male who presents with symptoms of upper airway obstruction/throat tightness and dyspnea on exertion.    He experiences a sensation of throat tightness and shortness of breath upon exertion, ongoing for the past couple of years. These symptoms were first noticed while snorkeling and have persisted, being triggered by physical activities such as carrying ceramic tiles over short distances, leading to gasping for breath and a sensation of throat constriction.   He has been evaluated by a pulmonary specialist and has tried multiple inhalers without relief   PAIN:  Are you having pain? No  FALLS: Has patient fallen in last 6 months? No, Number of falls: 0  LIVING ENVIRONMENT: Lives with: lives with their spouse Lives in: House/apartment  PLOF:Level of assistance: Independent with ADLs, Independent with IADLs Employment: Full-time employment  PATIENT GOALS: "to be able to breathe"  OBJECTIVE:  Note: Objective measures were completed at Evaluation unless otherwise noted.  DIAGNOSTIC FINDINGS: The nasopharynx was also patent without mass or lesion. The base of tongue was visualized and was normal. There were no signs of pooling of secretions in the piriform sinuses. The true vocal folds were mobile bilaterally. There were no signs of glottic or supraglottic mucosal  lesion or mass. There was moderate interarytenoid pachydermia and post cricoid edema. The telescope was then slowly withdrawn and the patient tolerated the procedure throughout.  Chronic Gastroesophageal Reflux Disease (GERD) Currently on generic Pepcid twice daily. 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 Symptoms include throat irritation and globus sensation. Reflux can cause throat irritation and globus sensation. Recommended a seaweed-based supplement for throat irritation and reflux symptoms. - Continue generic Pepcid 20 mg twice daily. -  Recommend reflux gourmet post-meals for throat irritation and reflux symptoms.  SOCIAL HISTORY: Occupation: Dietitian, Agricultural engineer intake: suboptimal Caffeine/alcohol intake: moderate Daily voice use: moderate  PERCEPTUAL VOICE ASSESSMENT: Voice quality: hoarse and low vocal intensity Vocal abuse: habitual throat clearing and abnormal breathing pattern Resonance: normal Respiratory function: thoracic breathing  OBJECTIVE VOICE ASSESSMENT: Maximum phonation time for sustained "ah": WNL Conversational loudness range: 65 dB Laryngeal obstruction sx: cough, can't breathe, throat clears Triggers: exercise, bending over , LPR- denies sx with scents  PATIENT REPORTED OUTCOME MEASURES (PROM): Vocal Cord Dysfunction Questionnaire (VCD-Q) 47 - he rated a 5, or strongly agree for sx being in upper throat, feeling like he can't get a breath in, and worse sx breathing in. He rated a 4 or agree for sx coming on suddenly, feeling something in his throat he can't clear, noisy breathing, specific triggers and being frustrated his sx haven't been understood.   Reflux Severity Index RSI: 22 (10-13 is normal)                                                                                                 TREATMENT DATE:  10/18/23: Pt continues to endorse benefits of cough suppression techniques and reduction in coughing episodes. Continued to address utilization of strategies during exacerbating events. Simulated work related events this date with lifting, carrying, walking with weights to increase exertion. SLP provided education on pacing d/t heavy breathing noted throughout which may further exacerbate sx. Per pt, he usually is trying to hurry at work, stating "time is money." Is agreeable to having ILO attack is counterproductive to productivity. Pt demonstrates increasing awareness of precipatory sensations and preemptive utilization of breathing techniques to avoid onset of ILO attack. Pt reports  success in breathing prior to exertion to decrease muscle tension which has been helpful in reduced overall frequency or severity of attacks.   10/15/23: Endorsed benefit of cough suppression techniques since evaluation. Reported increased coughing in the morning after doffing CPAP, bending over to tie shoes, and exercising - briskly walking and picking up items. Targeted carryover of cough suppression techniques in triggering situations today, with pt able to demonstrate strategies given rare min A for cough x1 and occasional min A for throat clear alternatives. Provided recommendations on patient instructions.   10/02/23 (eval day): Initiated training in suppression strategies for VCD/ILO (inspiratory laryngeal obstruction). After initial modeling and vocal cues to keep his shoulders down during inhalation as well as keep throat and neck relaxed, [name] demonstrated accurate relaxed abdominal sniff-blow with audible blow. 10/10x and through out the remainder of the session. Instructed him to use this in conjunction with  intermitted swallows or sips when he has an attack can't can't breathe as well as when he is coughing to reduce cough episode. Instructed him to use abdominal sniff-blow to pre-treat sx when he know he will trigger this (ex: lifting and walking with tile bags or exercising or bending over). He endorses globus sensation which he relieves with throat clearing. Trained in use of throat clear suppression strategies including swallow, sip and sniff blow until sensation has passed   PATIENT EDUCATION: Education details: See Treatment, See Patient Instructions, strategies to manage VCD, vocal hygiene, reflux precautions,  Person educated: Patient Education method: Explanation, Demonstration, Verbal cues, and Handouts Education comprehension: verbalized understanding, verbal cues required, and needs further education  HOME EXERCISE PROGRAM: Abdominal sniff- blow with slightly audible blow to  treat and prevent VCD sx  GOALS: Goals reviewed with patient? Yes   LONG TERM GOALS: Target date: 11/27/23  Pt will carryover strategies to suppress/eliminate VCD symptoms with occasional min A Baseline:  Goal status: MET  2.  Pt will use throat clear suppression strategies 4/5 opportunities with rare min A Baseline:  Goal status: IN PROGRESS  3.  Pt will report 50% reduction in VCD sx (subjectively) over 1 week Baseline:  Goal status: IN PROGRESS  4.  Pt will verbalize 3 reflux precautions with mod I Baseline:  Goal status: IN PROGRESS   ASSESSMENT:  CLINICAL IMPRESSION: Mitchell Todd "Minh Roanhorse is a 68 y.o. male who was seen today for vocal cord dysfunction (VCD). Continued education and instruction of VCD strategies for cough suppression and throat clear alternatives with rare to occasional min A provided. I recommend skilled ST to minimize VCD attacks for improved safety and QOL.   OBJECTIVE IMPAIRMENTS: include voice disorder. These impairments are limiting patient from effectively communicating at home and in community and safety when swallowing. Factors affecting potential to achieve goals and functional outcome are  n/a .Marland Kitchen Patient will benefit from skilled SLP services to address above impairments and improve overall function.  REHAB POTENTIAL: Good  PLAN:  SLP FREQUENCY: 2x/week  SLP DURATION: 8 weeks  PLANNED INTERVENTIONS: Environmental controls, Functional tasks, SLP instruction and feedback, Compensatory strategies, Patient/family education, and [id number] Treatment of speech (30 or 45 min)     [employer], Student-SLP 10/18/2023, 8:00 AM

## 2023-10-20 NOTE — Progress Notes (Unsigned)
 Karren Burly, MD 11/26/2022 Dyspnea on exertion: First noted a couple years ago with snorkeling which has been persistent and now progressive to activities around his property.  He has an approximate 60+ pack year smoking history.  Is quite possible that this is the primary contributor symptoms.  Possible development of asthma.  Possible cardiac contributors as well.  Albuterol has not helped much.  Chest x-ray clear.  PFTs demonstrate significant bronchodilator response.  Trelegy prescribed last visit, has helped.  New prescription today.  Co-pay card provided.  Samples also provided.  Nocturnal cough: Reflux versus etiologies discussed above.  Seems to be less of an issue since taking Trelegy. =============================================================================   05/27/23- 67yoM former smoker (60+ pkyrs) followed for OSA, Asthma, complicated by HTN, CAD, GERD/ esophageal stricture, BPH, Gout,  Followed by Dr Judeth Horn for general pulmonary. HST 03/16/23-  AHI 12.4/hr, desaturation to 74%, body weight 202 lbs For treatment decision- Body weight today-212 lbs Trelegy affected eyesight and was discontinued pending opth f/u. Saw Cardiology for DOE, tests pending. Discussed results of sleep study which confirmed ongoing OSA.  We are ordering replacement of old CPAP. He will f/u with Dr Judeth Horn for dyspnea.  10/22/23- 68yoM former smoker (60+ pkyrs) followed for OSA, Asthma, complicated by HTN, CAD, GERD/ esophageal stricture, BPH, Gout,  Followed by Dr Judeth Horn for general pulmonary- former smoker with DOE. CPAP auto 5-15/ [employer]         replacement ordered 05/27/23 Download compliance- Body weight today    ROS-see HPI   + = positive Constitutional:    weight loss, night sweats, fevers, chills, +fatigue, lassitude. HEENT:    headaches, difficulty swallowing, tooth/dental problems, sore throat,       sneezing, itching, ear ache, nasal congestion, post nasal drip, snoring CV:     chest pain, orthopnea, PND, swelling in lower extremities, anasarca,           dizziness, palpitations Resp:   +shortness of breath with exertion or at rest.                productive cough,   non-productive cough, coughing up of blood.              change in color of mucus.  wheezing.   Skin:    rash or lesions. GI:  No-   heartburn, indigestion, abdominal pain, nausea, vomiting, diarrhea,                 change in bowel habits, loss of appetite GU: dysuria, change in color of urine, no urgency or frequency.   flank pain. MS:   joint pain, stiffness, decreased range of motion, back pain.+ leg cramps Neuro-     nothing unusual Psych:  change in mood or affect.  depression or anxiety.   memory loss.  OBJ- Physical Exam General- Alert, Oriented, Affect-appropriate, Distress- none acute Skin- rash-none, lesions- none, excoriation- none Lymphadenopathy- none Head- atraumatic            Eyes- Gross vision intact, PERRLA, conjunctivae and secretions clear            Ears- Hearing, canals-normal            Nose- Clear, no-Septal dev, mucus, polyps, erosion, perforation             Throat- Mallampati II , mucosa clear , drainage- none, tonsils- atrophic Neck- flexible , trachea midline, no stridor , thyroid nl, carotid no bruit Chest - symmetrical excursion , unlabored  Heart/CV- RRR , no murmur , no gallop  , no rub, nl s1 s2                           - JVD- none , edema- none, stasis changes- none, varices- none           Lung- clear to P&A/ +diminished, wheeze- none, cough- none , dullness-none, rub- none           Chest wall-  Abd-  Br/ Gen/ Rectal- Not done, not indicated Extrem- cyanosis- none, clubbing, none, atrophy- none, strength- nl Neuro- grossly intact to observation

## 2023-10-21 ENCOUNTER — Ambulatory Visit

## 2023-10-21 ENCOUNTER — Encounter (HOSPITAL_BASED_OUTPATIENT_CLINIC_OR_DEPARTMENT_OTHER): Payer: Self-pay

## 2023-10-21 DIAGNOSIS — R498 Other voice and resonance disorders: Secondary | ICD-10-CM

## 2023-10-21 NOTE — Therapy (Signed)
 OUTPATIENT SPEECH LANGUAGE PATHOLOGY VOICE TREATMENT   Patient Name: Mitchell Todd MRN: [id number] DOB:08-21-1955, 68 y.o., male Today's Date: 10/21/2023  PCP: Mitchell Lund, PA REFERRING PROVIDER: Ashok Croon MD  END OF SESSION:  End of Session - 10/21/23 0802     Visit Number 4    Number of Visits 17    Date for SLP Re-Evaluation 11/27/23    Authorization Type HTA    SLP Start Time 0802    SLP Stop Time  0845    SLP Time Calculation (min) 43 min    Activity Tolerance Patient tolerated treatment well                Past Medical History:  Diagnosis Date   Arthritis    knees oa   Concussion 04/2018   no residual from   DOE (dyspnea on exertion)    Eczema    Elevated cholesterol    GERD (gastroesophageal reflux disease)    Gout    Hypertension    Sleep apnea    ues cpap set on 7  uses 2 oe 3 night s per week   Urinary frequency    Wears glasses    Past Surgical History:  Procedure Laterality Date   APPENDECTOMY  as child   foot cyst removed Right 2012   TONSILLECTOMY  as child   TRANSURETHRAL RESECTION OF PROSTATE N/A 11/21/2020   Procedure: TRANSURETHRAL RESECTION OF THE PROSTATE (TURP)/ BIPOLAR;  Surgeon: Mitchell Hick, MD;  Location: [facility];  Service: Urology;  Laterality: N/A;   trigger finger surgeyr  left thumb 2012   Patient Active Problem List   Diagnosis Date Noted   Dyspnea on exertion 02/22/2023   OSA (obstructive sleep apnea)    BPH (benign prostatic hyperplasia) 11/21/2020   CAD 11/14/2007   ESOPHAGEAL STRICTURE 11/14/2007   GERD 11/14/2007    Onset date: 09/09/2023 (referral date) - onset 2-3 years ago  REFERRING DIAG: 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    He experiences a sensation of throat tightness and shortness of breath upon exertion, ongoing for the past couple of years. These symptoms were first noticed while snorkeling and have persisted, being triggered by physical activities such as carrying ceramic tiles over short distances, leading to gasping for breath and a sensation of throat constriction.   He has been evaluated by a pulmonary specialist and has tried multiple inhalers without relief   PAIN:  Are you having pain? No  FALLS: Has patient fallen in last 6 months? No, Number of falls: 0  LIVING ENVIRONMENT: Lives with: lives with their spouse Lives in: House/apartment  PLOF:Level of assistance: Independent with ADLs, Independent with IADLs Employment: Full-time employment  PATIENT GOALS: "to be able to breathe"  OBJECTIVE:  Note: Objective measures were completed at Evaluation unless otherwise noted.  DIAGNOSTIC FINDINGS: The nasopharynx was also patent without mass or lesion. The base of tongue was visualized and was normal. There were no signs of pooling of secretions in the piriform sinuses. The true vocal folds were mobile bilaterally. There were no signs of glottic or supraglottic mucosal lesion or mass. There was moderate interarytenoid  pachydermia and post cricoid edema. The telescope was then slowly withdrawn and the patient tolerated the procedure throughout.  Chronic Gastroesophageal Reflux Disease (GERD) Currently on generic Pepcid twice daily. 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 Symptoms include throat irritation and globus sensation. Reflux can cause throat irritation and globus sensation. Recommended a seaweed-based supplement for throat irritation and reflux symptoms. - Continue generic Pepcid 20 mg twice daily. - Recommend reflux gourmet post-meals for  throat irritation and reflux symptoms.  SOCIAL HISTORY: Occupation: Dietitian, Agricultural engineer intake: suboptimal Caffeine/alcohol intake: moderate Daily voice use: moderate  PERCEPTUAL VOICE ASSESSMENT: Voice quality: hoarse and low vocal intensity Vocal abuse: habitual throat clearing and abnormal breathing pattern Resonance: normal Respiratory function: thoracic breathing  OBJECTIVE VOICE ASSESSMENT: Maximum phonation time for sustained "ah": WNL Conversational loudness range: 65 dB Laryngeal obstruction sx: cough, can't breathe, throat clears Triggers: exercise, bending over , LPR- denies sx with scents  PATIENT REPORTED OUTCOME MEASURES (PROM): Vocal Cord Dysfunction Questionnaire (VCD-Q) 47 - he rated a 5, or strongly agree for sx being in upper throat, feeling like he can't get a breath in, and worse sx breathing in. He rated a 4 or agree for sx coming on suddenly, feeling something in his throat he can't clear, noisy breathing, specific triggers and being frustrated his sx haven't been understood.   Reflux Severity Index RSI: 22 (10-13 is normal)                                                                                                 TREATMENT DATE:  10/21/23: Continues to report successful carryover of cough suppression techniques and throat clear alternatives from previous sessions. Endorsed some ongoing need for throat clearing, with additional education and strategies provided today to reduce habitual pattern. Engaged patient in therapeutic simulation task of trigger VCD situation. Rare min A provided for diaphragmatic breathing throughout challenging task of lifting heavy objects from varied heights. No coughing or throat clearing evidenced this session. Provided ongoing education and instruction of behavioral reflux management and strategies, with handout provided. Pt verbalized understanding. Decreased frequency to once a week based on success thus far.  10/18/23:  Pt continues to endorse benefits of cough suppression techniques and reduction in coughing episodes. Continued to address utilization of strategies during exacerbating events. Simulated work related events this date with lifting, carrying, walking with weights to increase exertion. SLP provided education on pacing d/t heavy breathing noted throughout which may further exacerbate sx. Per pt, he usually is trying to hurry at work, stating "time is money." Is agreeable to having ILO attack is counterproductive to productivity. Pt demonstrates increasing awareness of precipatory sensations and preemptive utilization of breathing techniques to avoid onset of ILO attack. Pt reports success in breathing prior to exertion to decrease muscle tension which has been helpful in reduced overall frequency or severity of attacks.   10/15/23: Endorsed benefit of cough suppression techniques since evaluation. Reported increased coughing in the morning after doffing CPAP, bending over to tie shoes, and exercising - briskly walking and picking up items. Targeted carryover of cough suppression techniques  in triggering situations today, with pt able to demonstrate strategies given rare min A for cough x1 and occasional min A for throat clear alternatives. Provided recommendations on patient instructions.   10/02/23 (eval day): Initiated training in suppression strategies for VCD/ILO (inspiratory laryngeal obstruction). After initial modeling and vocal cues to keep his shoulders down during inhalation as well as keep throat and neck relaxed, [name] demonstrated accurate relaxed abdominal sniff-blow with audible blow. 10/10x and through out the remainder of the session. Instructed him to use this in conjunction with intermitted swallows or sips when he has an attack can't can't breathe as well as when he is coughing to reduce cough episode. Instructed him to use abdominal sniff-blow to pre-treat sx when he know he will trigger this (ex:  lifting and walking with tile bags or exercising or bending over). He endorses globus sensation which he relieves with throat clearing. Trained in use of throat clear suppression strategies including swallow, sip and sniff blow until sensation has passed   PATIENT EDUCATION: Education details: See Treatment, See Patient Instructions, strategies to manage VCD, vocal hygiene, reflux precautions,  Person educated: Patient Education method: Explanation, Demonstration, Verbal cues, and Handouts Education comprehension: verbalized understanding, verbal cues required, and needs further education  HOME EXERCISE PROGRAM: Abdominal sniff- blow with slightly audible blow to treat and prevent VCD sx GOALS: Goals reviewed with patient? Yes   LONG TERM GOALS: Target date: 11/27/23  Pt will carryover strategies to suppress/eliminate VCD symptoms with occasional min A Baseline:  Goal status: MET  2.  Pt will use throat clear suppression strategies 4/5 opportunities with rare min A Baseline:  Goal status: IN PROGRESS  3.  Pt will report 50% reduction in VCD sx (subjectively) over 1 week Baseline:  Goal status: MET  4.  Pt will verbalize 3 reflux precautions with mod I Baseline:  Goal status: IN PROGRESS   ASSESSMENT:  CLINICAL IMPRESSION: Isacc "Kaynan Klonowski is a 68 y.o. male who was seen today for vocal cord dysfunction (VCD). Continued education and instruction of VCD strategies for cough suppression and throat clear alternatives with rare min A provided. I recommend skilled ST to minimize VCD attacks for improved safety and QOL.   OBJECTIVE IMPAIRMENTS: include voice disorder. These impairments are limiting patient from effectively communicating at home and in community and safety when swallowing. Factors affecting potential to achieve goals and functional outcome are  n/a .Marland Kitchen Patient will benefit from skilled SLP services to address above impairments and improve overall function.  REHAB  POTENTIAL: Good  PLAN:  SLP FREQUENCY: 2x/week  SLP DURATION: 8 weeks  PLANNED INTERVENTIONS: Environmental controls, Functional tasks, SLP instruction and feedback, Compensatory strategies, Patient/family education, and [id number] Treatment of speech (30 or 45 min)     Gracy Racer, CCC-SLP 10/21/2023, 8:55 AM

## 2023-10-21 NOTE — Patient Instructions (Signed)
 ACID REFLUX can be a possible cause of a voice disorder or throat irritation. Acid reflux is a disorder where acid from your stomach is abnormally spilled over onto your voice box after eating, during sleep, or even during singing. Acid reflux causes irritation and inflammation to your vocal folds and should be avoided and treated by changing eating habits, changing lifestyle, and taking medication (if prescribed by your doctor).   CHANGE EATING HABITS   Avoid "trigger" foods. Certain foods and drinks can trigger acid reflux.  1. Caffeine- in coffee, tea, chocolate, sodas  2. Carbonated beverages  3. Mint and menthol  4. Fatty/fried foods  5. Citrus fruits  6. Tomato products  7. Spicy foods  8. Alcohol    CHANGING LIFESTYLE HABITS   Drink 8 glasses of water per day (64oz)    Stop smoking   Avoid clearing your throat   Allow 3 hours between last big meal and going to bed at night   Keep yourself upright for one hour after you eat   Elevate the head of your bed using 6-inch blocks under the head of the bed or a bed wedge between the box spring and the mattress.   Eat small meals throughout the day rather than 3 big meals   Eat slowly   Wear loose clothing   TAKING MEDICATION   If prescribed one time a day, take 15-30 minutes before breakfast   If prescribed two times a day, take 15-30 minutes before breakfast and 15- 30 minutes before dinner   CHANGING THE WAY YOU USE YOUR VOICE  "Best voice/ Least effort"

## 2023-10-22 ENCOUNTER — Other Ambulatory Visit (HOSPITAL_BASED_OUTPATIENT_CLINIC_OR_DEPARTMENT_OTHER): Payer: Self-pay

## 2023-10-22 ENCOUNTER — Ambulatory Visit: Payer: No Typology Code available for payment source | Admitting: Internal Medicine

## 2023-10-22 ENCOUNTER — Encounter: Payer: Self-pay | Admitting: Internal Medicine

## 2023-10-22 VITALS — BP 120/64 | HR 103 | Temp 97.6°F | Ht 68.5 in | Wt 217.2 lb

## 2023-10-22 DIAGNOSIS — R911 Solitary pulmonary nodule: Secondary | ICD-10-CM

## 2023-10-22 DIAGNOSIS — G4733 Obstructive sleep apnea (adult) (pediatric): Secondary | ICD-10-CM

## 2023-10-22 DIAGNOSIS — R0609 Other forms of dyspnea: Secondary | ICD-10-CM

## 2023-10-22 NOTE — Patient Instructions (Addendum)
 Order sent to [employer] to shorten RAMP time and change the autopap range to 8-15. If that isn't comfortable, please let me know.  Keep appointment April 9 at 9:15 with Dr Judeth Horn for general pulmonary.

## 2023-10-23 ENCOUNTER — Ambulatory Visit

## 2023-10-28 ENCOUNTER — Encounter: Payer: Self-pay | Admitting: Speech Pathology

## 2023-10-28 ENCOUNTER — Ambulatory Visit: Admitting: Speech Pathology

## 2023-10-28 DIAGNOSIS — R498 Other voice and resonance disorders: Secondary | ICD-10-CM | POA: Diagnosis not present

## 2023-10-28 NOTE — Patient Instructions (Signed)
   At least try the sample of Reflux Gourmet before bed and see if you can put off waking up coughing or eliminate waking up coughing  The Reflux Gourmet blocks the reflux from coming up - ideally take after each meal and before bed  It lasts bout 3-4 hours - you can take another teaspoon if you wake coughing and see if it helps  Keep working on eliminating throat clears this week - try to improve being aware of the urge to clear and use your strategies to avoid this    ACID REFLUX PRECAUTIONS ACID REFLUX can be a possible cause of a voice disorder or throat irritation. Acid reflux is a disorder where acid from your stomach is abnormally spilled over onto your voice box after eating, during sleep, or even during singing. Acid reflux causes irritation and inflammation to your vocal folds and should be avoided and treated by changing eating habits, changing lifestyle, and taking medication (if prescribed by your doctor).   CHANGE EATING HABITS   Avoid "trigger" foods. Certain foods and drinks can trigger acid reflux.  1. Caffeine- in coffee, tea, chocolate, sodas  2. Carbonated beverages  3. Mint and menthol  4. Fatty/fried foods  5. Citrus fruits  6. Tomato products  7. Spicy foods  8. Alcohol    CHANGING LIFESTYLE HABITS   Drink 8 glasses of water per day (64oz)    Stop smoking   Avoid clearing your throat   Allow 3 hours between last big meal and going to bed at night   Keep yourself upright for one hour after you eat   Elevate the head of your bed using 6-inch blocks under the head of the bed or a bed wedge between the box spring and the mattress.   Eat small meals throughout the day rather than 3 big meals   Eat slowly   Wear loose clothing   CHANGING THE WAY YOU USE YOUR VOICE  "Best voice/ Least effort"

## 2023-10-28 NOTE — Therapy (Signed)
 OUTPATIENT SPEECH LANGUAGE PATHOLOGY VOICE TREATMENT   Patient Name: Mitchell Todd MRN: [id number] DOB:1955-12-25, 68 y.o., male Today's Date: 10/28/2023  PCP: Mitchell Lund, PA REFERRING PROVIDER: Ashok Croon MD  END OF SESSION:  End of Session - 10/28/23 0849     Visit Number 5    Number of Visits 17    Date for SLP Re-Evaluation 11/27/23    Authorization Type HTA    SLP Start Time 0845    SLP Stop Time  0915    SLP Time Calculation (min) 30 min    Activity Tolerance Patient tolerated treatment well                Past Medical History:  Diagnosis Date   Arthritis    knees oa   Concussion 04/2018   no residual from   DOE (dyspnea on exertion)    Eczema    Elevated cholesterol    GERD (gastroesophageal reflux disease)    Gout    Hypertension    Sleep apnea    ues cpap set on 7  uses 2 oe 3 night s per week   Urinary frequency    Wears glasses    Past Surgical History:  Procedure Laterality Date   APPENDECTOMY  as child   foot cyst removed Right 2012   TONSILLECTOMY  as child   TRANSURETHRAL RESECTION OF PROSTATE N/A 11/21/2020   Procedure: TRANSURETHRAL RESECTION OF THE PROSTATE (TURP)/ BIPOLAR;  Surgeon: Mitchell Hick, MD;  Location: [facility];  Service: Urology;  Laterality: N/A;   trigger finger surgeyr  left thumb 2012   Patient Active Problem List   Diagnosis Date Noted   Dyspnea on exertion 02/22/2023   OSA (obstructive sleep apnea)    BPH (benign prostatic hyperplasia) 11/21/2020   CAD 11/14/2007   ESOPHAGEAL STRICTURE 11/14/2007   GERD 11/14/2007    Onset date: 09/09/2023 (referral date) - onset 2-3 years ago  REFERRING DIAG: 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    He experiences a sensation of throat tightness and shortness of breath upon exertion, ongoing for the past couple of years. These symptoms were first noticed while snorkeling and have persisted, being triggered by physical activities such as carrying ceramic tiles over short distances, leading to gasping for breath and a sensation of throat constriction.   He has been evaluated by a pulmonary specialist and has tried multiple inhalers without relief   PAIN:  Are you having pain? No  FALLS: Has patient fallen in last 6 months? No, Number of falls: 0  LIVING ENVIRONMENT: Lives with: lives with their spouse Lives in: House/apartment  PLOF:Level of assistance: Independent with ADLs, Independent with IADLs Employment: Full-time employment  PATIENT GOALS: "to be able to breathe"  OBJECTIVE:  Note: Objective measures were completed at Evaluation unless otherwise noted.  DIAGNOSTIC FINDINGS: The nasopharynx was also patent without mass or lesion. The base of tongue was visualized and was normal. There were no signs of pooling of secretions in the piriform sinuses. The true vocal folds were mobile bilaterally. There were no signs of glottic or supraglottic mucosal lesion or mass. There was  moderate interarytenoid pachydermia and post cricoid edema. The telescope was then slowly withdrawn and the patient tolerated the procedure throughout.  Chronic Gastroesophageal Reflux Disease (GERD) Currently on generic Pepcid twice daily. 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 Symptoms include throat irritation and globus sensation. Reflux can cause throat irritation and globus sensation. Recommended a seaweed-based supplement for throat irritation and reflux symptoms. - Continue generic Pepcid 20 mg twice daily. - Recommend reflux gourmet post-meals for  throat irritation and reflux symptoms.  SOCIAL HISTORY: Occupation: Dietitian, Agricultural engineer intake: suboptimal Caffeine/alcohol intake: moderate Daily voice use: moderate  PERCEPTUAL VOICE ASSESSMENT: Voice quality: hoarse and low vocal intensity Vocal abuse: habitual throat clearing and abnormal breathing pattern Resonance: normal Respiratory function: thoracic breathing  OBJECTIVE VOICE ASSESSMENT: Maximum phonation time for sustained "ah": WNL Conversational loudness range: 65 dB Laryngeal obstruction sx: cough, can't breathe, throat clears Triggers: exercise, bending over , LPR- denies sx with scents  PATIENT REPORTED OUTCOME MEASURES (PROM): Vocal Cord Dysfunction Questionnaire (VCD-Q) 47 - he rated a 5, or strongly agree for sx being in upper throat, feeling like he can't get a breath in, and worse sx breathing in. He rated a 4 or agree for sx coming on suddenly, feeling something in his throat he can't clear, noisy breathing, specific triggers and being frustrated his sx haven't been understood.   Reflux Severity Index RSI: 22 (10-13 is normal)                                                                                                 TREATMENT DATE:   10/28/23: Mitchell Todd reports coughing with soup at an asian dinner this weekend - the spices in the soup. Mitchell Todd reports he was able to use abdominal sniff blow and swallows to stop the cough. He continues to report reduced coughing - we reviewed use of sniff blow to pre treat VCD before he carries heavy items at work and before he bends over to tie shoes. He demonstrates abdominal sniff blow with mod I. He reports using this pattern thorough out the day. Reviewed reflux precautions - Mitchell Todd these, however he elects to continue current diet. He does report that spaghetti resulted in significant reflux for extended time after the meal. I continue to review ENT recommendation for alginate to block reflux. He has not tried  this or tried the sample the ENT provided. Reviewed that PPI and H2 blockers reduce acid but do not reduce reflux. No throat clears this session. With questioning cues, Mitchell Todd triggers and sensations when he has urge to cough or clear his throat. Plan to initiate alginate after meals and before bed, focus on pre treating cough and eliminating throat clears. Likely d/c next session.    10/21/23: Continues to report successful carryover of cough suppression techniques and throat clear alternatives from previous sessions. Endorsed some ongoing need for throat clearing, with additional education and strategies provided today to reduce habitual pattern. Engaged patient in therapeutic simulation task of trigger VCD situation. Rare min A provided for diaphragmatic breathing throughout challenging task of lifting heavy objects from varied  heights. No coughing or throat clearing evidenced this session. Provided ongoing education and instruction of behavioral reflux management and strategies, with handout provided. Pt verbalized understanding. Decreased frequency to once a week based on success thus far.  10/18/23: Pt continues to endorse benefits of cough suppression techniques and reduction in coughing episodes. Continued to address utilization of strategies during exacerbating events. Simulated work related events this date with lifting, carrying, walking with weights to increase exertion. SLP provided education on pacing d/t heavy breathing noted throughout which may further exacerbate sx. Per pt, he usually is trying to hurry at work, stating "time is money." Is agreeable to having ILO attack is counterproductive to productivity. Pt demonstrates increasing awareness of precipatory sensations and preemptive utilization of breathing techniques to avoid onset of ILO attack. Pt reports success in breathing prior to exertion to decrease muscle tension which has been helpful in reduced overall frequency or  severity of attacks.   10/15/23: Endorsed benefit of cough suppression techniques since evaluation. Reported increased coughing in the morning after doffing CPAP, bending over to tie shoes, and exercising - briskly walking and picking up items. Targeted carryover of cough suppression techniques in triggering situations today, with pt able to demonstrate strategies given rare min A for cough x1 and occasional min A for throat clear alternatives. Provided recommendations on patient instructions.   10/02/23 (eval day): Initiated training in suppression strategies for VCD/ILO (inspiratory laryngeal obstruction). After initial modeling and vocal cues to keep his shoulders down during inhalation as well as keep throat and neck relaxed, Mitchell Todd demonstrated accurate relaxed abdominal sniff-blow with audible blow. 10/10x and through out the remainder of the session. Instructed him to use this in conjunction with intermitted swallows or sips when he has an attack can't can't breathe as well as when he is coughing to reduce cough episode. Instructed him to use abdominal sniff-blow to pre-treat sx when he know he will trigger this (ex: lifting and walking with tile bags or exercising or bending over). He endorses globus sensation which he relieves with throat clearing. Trained in use of throat clear suppression strategies including swallow, sip and sniff blow until sensation has passed   PATIENT EDUCATION: Education details: See Treatment, See Patient Instructions, strategies to manage VCD, vocal hygiene, reflux precautions,  Person educated: Patient Education method: Explanation, Demonstration, Verbal cues, and Handouts Education comprehension: verbalized understanding, verbal cues required, and needs further education  HOME EXERCISE PROGRAM: Abdominal sniff- blow with slightly audible blow to treat and prevent VCD sx GOALS: Goals reviewed with patient? Yes   LONG TERM GOALS: Target date: 11/27/23  Pt will  carryover strategies to suppress/eliminate VCD symptoms with occasional min A Baseline:  Goal status: MET  2.  Pt will use throat clear suppression strategies 4/5 opportunities with rare min A Baseline:  Goal status: IN PROGRESS  3.  Pt will report 50% reduction in VCD sx (subjectively) over 1 week Baseline:  Goal status: MET  4.  Pt will verbalize 3 reflux precautions with mod I Baseline:  Goal status: IN PROGRESS   ASSESSMENT:  CLINICAL IMPRESSION: Mitchell "Ruston Fedora is a 68 y.o. male who was seen today for vocal cord dysfunction (VCD). Continued education and instruction of VCD strategies for cough suppression and throat clear alternatives with rare min A provided. I recommend skilled ST to minimize VCD attacks for improved safety and QOL.   OBJECTIVE IMPAIRMENTS: include voice disorder. These impairments are limiting patient from effectively communicating at home and in community and safety  when swallowing. Factors affecting potential to achieve goals and functional outcome are  n/a .Marland Kitchen Patient will benefit from skilled SLP services to address above impairments and improve overall function.  REHAB POTENTIAL: Good  PLAN:  SLP FREQUENCY: 2x/week  SLP DURATION: 8 weeks  PLANNED INTERVENTIONS: Environmental controls, Functional tasks, SLP instruction and feedback, Compensatory strategies, Patient/family education, and [id number] Treatment of speech (30 or 45 min)     Ethelmae Ringel, Radene Journey, CCC-SLP 10/28/2023, 9:23 AM

## 2023-10-30 ENCOUNTER — Encounter: Admitting: Speech Pathology

## 2023-10-30 ENCOUNTER — Encounter: Payer: Self-pay | Admitting: Internal Medicine

## 2023-11-04 ENCOUNTER — Ambulatory Visit: Attending: Otolaryngology | Admitting: Speech Pathology

## 2023-11-04 ENCOUNTER — Encounter: Payer: Self-pay | Admitting: Speech Pathology

## 2023-11-04 DIAGNOSIS — R498 Other voice and resonance disorders: Secondary | ICD-10-CM | POA: Insufficient documentation

## 2023-11-04 NOTE — Therapy (Signed)
 OUTPATIENT SPEECH LANGUAGE PATHOLOGY VOICE TREATMENT & DISCHARGE SUMMARY   Patient Name: Mitchell Todd MRN: [id number] DOB:1955/12/23, 68 y.o., male Today's Date: 11/04/2023  PCP: Ceasar Lund, PA REFERRING PROVIDER: Ashok Croon MD  END OF SESSION:  End of Session - 11/04/23 0935     Visit Number 6    Number of Visits 17    Date for SLP Re-Evaluation 11/27/23    Authorization Type HTA    SLP Start Time 0930    SLP Stop Time  1000    SLP Time Calculation (min) 30 min    Activity Tolerance Patient tolerated treatment well                Past Medical History:  Diagnosis Date   Arthritis    knees oa   Concussion 04/2018   no residual from   DOE (dyspnea on exertion)    Eczema    Elevated cholesterol    GERD (gastroesophageal reflux disease)    Gout    Hypertension    Sleep apnea    ues cpap set on 7  uses 2 oe 3 night s per week   Urinary frequency    Wears glasses    Past Surgical History:  Procedure Laterality Date   APPENDECTOMY  as child   foot cyst removed Right 2012   TONSILLECTOMY  as child   TRANSURETHRAL RESECTION OF PROSTATE N/A 11/21/2020   Procedure: TRANSURETHRAL RESECTION OF THE PROSTATE (TURP)/ BIPOLAR;  Surgeon: Jannifer Hick, MD;  Location: [facility];  Service: Urology;  Laterality: N/A;   trigger finger surgeyr  left thumb 2012   Patient Active Problem List   Diagnosis Date Noted   Dyspnea on exertion 02/22/2023   OSA (obstructive sleep apnea)    BPH (benign prostatic hyperplasia) 11/21/2020   CAD 11/14/2007   ESOPHAGEAL STRICTURE 11/14/2007   GERD 11/14/2007    Onset date: 09/09/2023 (referral date) - onset 2-3 years ago  REFERRING DIAG: 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    He experiences a sensation of throat tightness and shortness of breath upon exertion, ongoing for the past couple of years. These symptoms were first noticed while snorkeling and have persisted, being triggered by physical activities such as carrying ceramic tiles over short distances, leading to gasping for breath and a sensation of throat constriction.   He has been evaluated by a pulmonary specialist and has tried multiple inhalers without relief   PAIN:  Are you having pain? No  FALLS: Has patient fallen in last 6 months? No, Number of falls: 0  LIVING ENVIRONMENT: Lives with: lives with their spouse Lives in: House/apartment  PLOF:Level of assistance: Independent with ADLs, Independent with IADLs Employment: Full-time employment  PATIENT GOALS: "to be able to breathe"  OBJECTIVE:  Note: Objective measures were completed at Evaluation unless otherwise noted.  DIAGNOSTIC FINDINGS: The nasopharynx was also patent without mass or lesion. The base of tongue was visualized and was normal. There were no signs of pooling of secretions in the piriform sinuses. The true vocal folds were mobile bilaterally. There were no signs of glottic or supraglottic mucosal lesion or  mass. There was moderate interarytenoid pachydermia and post cricoid edema. The telescope was then slowly withdrawn and the patient tolerated the procedure throughout.  Chronic Gastroesophageal Reflux Disease (GERD) Currently on generic Pepcid twice daily. 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 Symptoms include throat irritation and globus sensation. Reflux can cause throat irritation and globus sensation. Recommended a seaweed-based supplement for throat irritation and reflux symptoms. - Continue generic Pepcid 20 mg twice daily. - Recommend  reflux gourmet post-meals for throat irritation and reflux symptoms.  SOCIAL HISTORY: Occupation: Dietitian, Agricultural engineer intake: suboptimal Caffeine/alcohol intake: moderate Daily voice use: moderate  PERCEPTUAL VOICE ASSESSMENT: Voice quality: hoarse and low vocal intensity Vocal abuse: habitual throat clearing and abnormal breathing pattern Resonance: normal Respiratory function: thoracic breathing  OBJECTIVE VOICE ASSESSMENT: Maximum phonation time for sustained "ah": WNL Conversational loudness range: 65 dB Laryngeal obstruction sx: cough, can't breathe, throat clears Triggers: exercise, bending over , LPR- denies sx with scents  PATIENT REPORTED OUTCOME MEASURES (PROM): Vocal Cord Dysfunction Questionnaire (VCD-Q) 47 - he rated a 5, or strongly agree for sx being in upper throat, feeling like he can't get a breath in, and worse sx breathing in. He rated a 4 or agree for sx coming on suddenly, feeling something in his throat he can't clear, noisy breathing, specific triggers and being frustrated his sx haven't been understood.   11/04/23: Mitchell Todd improve scores on all items rated a 4 or 5 except being aware of his triggers which appropriately remained a 4  Reflux Severity Index RSI: 22 (10-13 is normal)                                                                                                 TREATMENT DATE:   11/04/23: Mitchell Todd has been consistent using abdominal sniff blow before physical exertion  with success. He is also using sniff-blow before and during bending down as well which he reports helps prevent ILO attack. He verbalized 3 reflux precautions with rare min A- he has not initiated Reflux Gourmet however he plans to order it this week. He reports 75% reduction inn ILO sx. VCD-Q re-administered - see above. Mitchell Todd demonstrated abdominal sniff blow with mod I - he reports using this through out the day - Goals met, d/c ST - pt is in agreement.   10/28/23: Mitchell Todd reports  coughing with soup at an asian dinner this weekend - the spices in the soup. Mitchell Todd reports he was able to use abdominal sniff blow and swallows to stop the cough. He continues to report reduced coughing - we reviewed use of sniff blow to pre treat VCD before he carries heavy items at work and before he bends over to tie shoes. He demonstrates abdominal sniff blow with mod I. He reports using this pattern thorough out the day. Reviewed reflux precautions - Mitchell Todd these, however he elects to continue current diet. He does report that spaghetti resulted in significant reflux for extended time after the meal. I continue to review ENT recommendation for alginate to block reflux. He has not tried this or tried the sample  the ENT provided. Reviewed that PPI and H2 blockers reduce acid but do not reduce reflux. No throat clears this session. With questioning cues, Mitchell Todd triggers and sensations when he has urge to cough or clear his throat. Plan to initiate alginate after meals and before bed, focus on pre treating cough and eliminating throat clears. Likely d/c next session.    10/21/23: Continues to report successful carryover of cough suppression techniques and throat clear alternatives from previous sessions. Endorsed some ongoing need for throat clearing, with additional education and strategies provided today to reduce habitual pattern. Engaged patient in therapeutic simulation task of trigger VCD situation. Rare min A provided for diaphragmatic breathing throughout challenging task of lifting heavy objects from varied heights. No coughing or throat clearing evidenced this session. Provided ongoing education and instruction of behavioral reflux management and strategies, with handout provided. Pt verbalized understanding. Decreased frequency to once a week based on success thus far.  10/18/23: Pt continues to endorse benefits of cough suppression techniques and reduction in coughing episodes.  Continued to address utilization of strategies during exacerbating events. Simulated work related events this date with lifting, carrying, walking with weights to increase exertion. SLP provided education on pacing d/t heavy breathing noted throughout which may further exacerbate sx. Per pt, he usually is trying to hurry at work, stating "time is money." Is agreeable to having ILO attack is counterproductive to productivity. Pt demonstrates increasing awareness of precipatory sensations and preemptive utilization of breathing techniques to avoid onset of ILO attack. Pt reports success in breathing prior to exertion to decrease muscle tension which has been helpful in reduced overall frequency or severity of attacks.   10/15/23: Endorsed benefit of cough suppression techniques since evaluation. Reported increased coughing in the morning after doffing CPAP, bending over to tie shoes, and exercising - briskly walking and picking up items. Targeted carryover of cough suppression techniques in triggering situations today, with pt able to demonstrate strategies given rare min A for cough x1 and occasional min A for throat clear alternatives. Provided recommendations on patient instructions.   10/02/23 (eval day): Initiated training in suppression strategies for VCD/ILO (inspiratory laryngeal obstruction). After initial modeling and vocal cues to keep his shoulders down during inhalation as well as keep throat and neck relaxed, Mitchell Todd demonstrated accurate relaxed abdominal sniff-blow with audible blow. 10/10x and through out the remainder of the session. Instructed him to use this in conjunction with intermitted swallows or sips when he has an attack can't can't breathe as well as when he is coughing to reduce cough episode. Instructed him to use abdominal sniff-blow to pre-treat sx when he know he will trigger this (ex: lifting and walking with tile bags or exercising or bending over). He endorses globus sensation which  he relieves with throat clearing. Trained in use of throat clear suppression strategies including swallow, sip and sniff blow until sensation has passed   PATIENT EDUCATION: Education details: See Treatment, See Patient Instructions, strategies to manage VCD, vocal hygiene, reflux precautions,  Person educated: Patient Education method: Explanation, Demonstration, Verbal cues, and Handouts Education comprehension: verbalized understanding, verbal cues required, and needs further education  HOME EXERCISE PROGRAM: Abdominal sniff- blow with slightly audible blow to treat and prevent VCD sx GOALS: Goals reviewed with patient? Yes   LONG TERM GOALS: Target date: 11/27/23  Pt will carryover strategies to suppress/eliminate VCD symptoms with occasional min A Baseline:  Goal status: MET  2.  Pt will use throat clear suppression strategies 4/5 opportunities  with rare min A Baseline:  Goal status: MET  3.  Pt will report 50% reduction in VCD sx (subjectively) over 1 week Baseline:  Goal status: MET - he reports 75% reduction in VCD sx at d/c  4.  Pt will verbalize 3 reflux precautions with mod I Baseline:  Goal status: MET   ASSESSMENT:  CLINICAL IMPRESSION: Mitchell Todd is a 68 y.o. male who was seen today for vocal cord dysfunction (VCD). Continued education and instruction of VCD strategies for cough suppression and throat clear alternatives with rare min A provided. Goals met, pt is pleased with current level of function - d/c ST - he is in agreement  OBJECTIVE IMPAIRMENTS: include voice disorder. These impairments are limiting patient from effectively communicating at home and in community and safety when swallowing. Factors affecting potential to achieve goals and functional outcome are  n/a .Marland Kitchen Patient will benefit from skilled SLP services to address above impairments and improve overall function.  REHAB POTENTIAL: Good  PLAN:  SLP FREQUENCY: 2x/week  SLP  DURATION: 8 weeks  PLANNED INTERVENTIONS: Environmental controls, Functional tasks, SLP instruction and feedback, Compensatory strategies, Patient/family education, and [id number] Treatment of speech (30 or 45 min)   SPEECH THERAPY DISCHARGE SUMMARY  Visits from Start of Care: 6  Current functional level related to goals / functional outcomes: See goals above   Remaining deficits: LPR after meals and in am - I continue to encourage him to order Reflux Gourmet   Education / Equipment:  Throat clear alternative/suppression; cough suppression; breathing strategies to eliminate VCD/ILO sx, reflux precautions  Patient agrees to discharge. Patient goals were met. Patient is being discharged due to meeting the stated rehab goals.Dara Hoyer, CCC-SLP 11/04/2023, 9:58 AM

## 2023-11-06 ENCOUNTER — Encounter: Payer: Self-pay | Admitting: Pulmonary Disease

## 2023-11-06 ENCOUNTER — Ambulatory Visit: Payer: HMO | Admitting: Pulmonary Disease

## 2023-11-06 VITALS — BP 132/82 | HR 83 | Ht 68.0 in | Wt 216.0 lb

## 2023-11-06 DIAGNOSIS — G4733 Obstructive sleep apnea (adult) (pediatric): Secondary | ICD-10-CM

## 2023-11-06 DIAGNOSIS — R0609 Other forms of dyspnea: Secondary | ICD-10-CM | POA: Diagnosis not present

## 2023-11-06 NOTE — Patient Instructions (Signed)
 Nice to see you again  Okay to stop inhaler since they do not seem to help  We reviewed your CT scan, everything looks stable, you have a repeat CT scan in March 2026 just to keep an eye on this, I agree with this plan  Return to clinic in 1 year after CT scan to discuss results

## 2023-11-06 NOTE — Progress Notes (Signed)
 @Patient  ID: Mitchell Todd, male    DOB: Aug 03, 1955, 68 y.o.   MRN: [id number]  Chief Complaint  Patient presents with   Follow-up    Referring provider: Ceasar Lund, PA  HPI:   68 y.o. man whom we are seeing for evaluation of dyspnea on exertion.  Most recent sleep evaluation [name], MD reviewed.  Most recent ENT note reviewed.  Tried different failures..  Described discomfort in his upper throat.  Referred to ENT based on this.  Fiberoptic exam reviewed, normal.  Vocal cord dysfunction was considered.  He was sent to speech pathology, speech training.  He thinks these episodes or these exercises have certainly helped in someway.  He has not been using his Stiolto.  He sees no difference in his breathing for the worse after stopping this.  HPI at initial visit: Patient was in usual state of health.  Very active.  Works outside a lot.  Noted went snorkeling 2 years ago in the [place] he could not breathe well through the snorkel.  Very short of breath.  Has returned to the boat.  This has been repeated twice, trips in the last 2 years.  In addition to the initial visit.  Similar symptoms.  His most recent palpating over try.  In addition, over the last several months he has noted worsening dyspnea on exertion.  When carrying bags, heavy items around his property.  No time of day when things are better or worse.  No position to make things better or worse.  Rest makes it better.  Has used albuterol without improvement.  No seasonal or environmental factors he can identify to make things better or worse.  No other alleviating or exacerbating factors.  He reports having a smoking history, 60+ pack year.  Quit 25 years ago.  Denies any history of asthma.  Has had no chest imaging that I can see, reports none.  He did recently have labs, reviewed, no evidence of anemia.  No chest pain.  Not evaluated by cardiology in the past.  Questionaires / Pulmonary Flowsheets:   ACT:      No  data to display          MMRC:     No data to display          Epworth:      No data to display          Tests:   FENO:  No results found for: "NITRICOXIDE"  PFT:    Latest Ref Rng & Units 10/30/2022    8:51 AM  PFT Results  FVC-Pre L 2.67   FVC-Predicted Pre % 63   FVC-Post L 3.23   FVC-Predicted Post % 76   Pre FEV1/FVC % % 78   Post FEV1/FCV % % 72   FEV1-Pre L 2.09   FEV1-Predicted Pre % 67   FEV1-Post L 2.32   DLCO uncorrected ml/min/mmHg 25.63   DLCO UNC% % 102   DLCO corrected ml/min/mmHg 25.63   DLCO COR %Predicted % 102   DLVA Predicted % 101   TLC L 6.51   TLC % Predicted % 96   RV % Predicted % 81   Personally reviewed interpreted as spirometry suggestive of moderate restriction versus air trapping.  Significant bronchodilator response in FVC.  Lung volumes within normal limits.  DLCO within normal limits.  WALK:      No data to display          Imaging: CT  Chest Wo Contrast Result Date: 10/21/2023 CLINICAL DATA:  Abnormal chest x-ray lung nodule more than 1 cm follow-up EXAM: CT CHEST WITHOUT CONTRAST TECHNIQUE: Multidetector CT imaging of the chest was performed following the standard protocol without IV contrast. RADIATION DOSE REDUCTION: This exam was performed according to the departmental dose-optimization program which includes automated exposure control, adjustment of the mA and/or kV according to patient size and/or use of iterative reconstruction technique. COMPARISON:  CT heart June 12, 2023 FINDINGS: Cardiovascular: No significant vascular findings. Normal heart size. No pericardial effusion. Mediastinum/Nodes: Multiple lymph nodes in the mediastinum, the largest in the pretracheal retrocaval distribution measures 1.9 by 1.6 cm. Precarinal adenopathy measuring 9 mm. Aortic pulmonic window adenopathy measuring 10 mm Prevascular aortic space adenopathy measuring 12 mm. Bilateral fatty replaced adenopathy the largest on the right  measures 20 mm Lungs/Pleura: Comparison with prior CT previously described right lung nodules are unchanged since prior examination, the right middle lobe nodules are fissural nodules. There is ill-defined left lower lobe infiltrates and atelectasis the should be correlated clinically. Upper Abdomen: No acute abnormality. Musculoskeletal: No chest wall mass or suspicious bone lesions identified. 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 *Comparison with prior CT previously described right lung nodules are unchanged since prior examination, the right middle lobe nodules are fissural nodules. Lung-RADS 2, benign appearance or behavior. Continue annual screening with low-dose chest CT without contrast in 12 months. Electronically Signed   By: Shaaron Adler M.D.   On: 10/21/2023 16:01    Lab Results: Personally reviewed CBC    Component Value Date/Time   WBC 9.2 11/22/2020 0244   RBC 4.32 11/22/2020 0244   HGB 13.6 11/22/2020 0244   HCT 40.3 11/22/2020 0244   PLT 188 11/22/2020 0244   MCV 93.3 11/22/2020 0244   MCH 31.5 11/22/2020 0244   MCHC 33.7 11/22/2020 0244   RDW 12.4 11/22/2020 0244    BMET    Component Value Date/Time   NA 142 06/03/2023 1421   K 4.3 06/03/2023 1421   CL 101 06/03/2023 1421   CO2 22 06/03/2023 1421   GLUCOSE 115 (H) 06/03/2023 1421   GLUCOSE 123 (H) 11/22/2020 0244   BUN 14 06/03/2023 1421   CREATININE 1.19 06/03/2023 1421   CALCIUM 9.1 06/03/2023 1421   GFRNONAA 56 (L) 11/22/2020 0244    BNP No results found for: "BNP"  ProBNP No results found for: "PROBNP"  Specialty Problems       Pulmonary Problems   Dyspnea on exertion   PFT 10/30/22      OSA (obstructive sleep apnea)   HST 03/16/23-  AHI 12.4/hr, desaturation to 74%, body weight 202 lbs       Allergies  Allergen Reactions    Penicillins     Childhood allergy     There is no immunization history on file for this patient.  Past Medical History:  Diagnosis Date   Arthritis    knees oa   Concussion 04/2018   no residual from   DOE (dyspnea on exertion)    Eczema    Elevated cholesterol    GERD (gastroesophageal reflux disease)    Gout    Hypertension    Sleep apnea    ues cpap set on 7  uses 2 oe 3 night s per week   Urinary frequency    Wears glasses  Tobacco History: Social History   Tobacco Use  Smoking Status Former  Smokeless Tobacco Never   Counseling given: Not Answered   Continue to not smoke  Outpatient Encounter Medications as of 11/06/2023  Medication Sig   albuterol (VENTOLIN HFA) 108 (90 Base) MCG/ACT inhaler Inhale 2 puffs into the lungs every 6 (six) hours as needed for wheezing or shortness of breath.   allopurinol (ZYLOPRIM) 300 MG tablet Take 300 mg by mouth daily.   amLODipine (NORVASC) 5 MG tablet Take 5 mg by mouth daily.   aspirin EC 81 MG tablet Take 81 mg by mouth daily. Swallow whole.   Cranberry-Vitamin C-Probiotic (AZO CRANBERRY PO) Take 500 mg by mouth daily.   famotidine (PEPCID) 40 MG tablet Take 40 mg by mouth at bedtime.   finasteride (PROSCAR) 5 MG tablet Take 5 mg by mouth daily.   hydrochlorothiazide (MICROZIDE) 12.5 MG capsule Take 12.5 mg by mouth daily.   hydrocortisone cream 1 % Apply 1 application  topically as needed for itching.   Loratadine 10 MG CAPS Take 10 mg by mouth daily.   Nutritional Supplements (GLUCOSAMINE COMPLEX PO) Take by mouth. With msm 1 tab daily   Pantoprazole Sodium (PROTONIX PO) Take 40 mg by mouth daily.   tadalafil (CIALIS) 5 MG tablet Take 5 mg by mouth daily.   telmisartan (MICARDIS) 80 MG tablet Take 80 mg by mouth daily.   Testosterone 10 MG/ACT (2%) GEL Place 1 mL onto the skin daily.   UNABLE TO FIND Take 10 mg by mouth at bedtime. Med Name: CBD Gummies   rosuvastatin (CRESTOR) 10 MG tablet Take 1 tablet (10 mg  total) by mouth daily.   No facility-administered encounter medications on file as of 11/06/2023.     Review of Systems  Review of Systems  N/a  Physical Exam  BP 132/82 (BP Location: Left Arm, Patient Position: Sitting, Cuff Size: Normal)   Pulse 83   Ht 5\' 8"  (1.727 m)   Wt 216 lb (98 kg)   SpO2 93%   BMI 32.84 kg/m   Wt Readings from Last 5 Encounters:  11/06/23 216 lb (98 kg)  10/22/23 217 lb 3.2 oz (98.5 kg)  09/09/23 205 lb (93 kg)  08/07/23 211 lb 3.2 oz (95.8 kg)  05/27/23 212 lb 12.8 oz (96.5 kg)    BMI Readings from Last 5 Encounters:  11/06/23 32.84 kg/m  10/22/23 32.54 kg/m  09/09/23 30.27 kg/m  08/07/23 31.65 kg/m  05/27/23 31.89 kg/m     Physical Exam General: Sitting in chair, no acute distress Eyes: EOMI, icterus Neck: Supple, JVP Pulmonary: Clear, normal work of breathing Abdomen: Nondistended, bowel sounds present Cardiovascular: Warm, no edema MSK: No synovitis, no joint effusion Neuro: Normal gait, no weakness Psych: Normal mood, full affect   Assessment & Plan:   Dyspnea on exertion: First noted a couple years ago with snorkeling which has been persistent and now progressive to activities around his property.  He has an approximate 60+ pack year smoking history.  Is quite possible that this is the primary contributor symptoms.  Possible development of asthma.  Possible cardiac contributors as well.  Albuterol has not helped much.  Chest x-ray clear.  PFTs demonstrate significant bronchodilator response.  Symptoms improved with Trelegy stopping for now given concern for eye changes.  He is since resume Trelegy given no changes or improvement in eye symptoms.  But now he does not think it helps much.  Cardiac evaluation in the meantime reveals low  normal EF and diastolic dysfunction, likely this is contributing somewhat with normal physiology worsening and diastology.  Not much to do to help with this.  He mentions he feels like the blockage  or difficulty is higher in his throat.  Referral to ENT to evaluate for structural changes, vocal cord dysfunction was reassuring.  He was referred to voice therapy for possible vocal cord dysfunction with mild improvement in symptoms.  Instructed not to the continue inhalers.  He he has stopped these without any adverse effect.  They have not helped significantly in the past.  Nocturnal cough: Reflux versus etiologies discussed above.  Seemed to be less of an issue when taking Trelegy.  No longer taking Trelegy.  OSA on CPAP: Encouraged ongoing follow-up with sleep doctor.    Return in about 1 year (around 11/05/2024) for f/u [name], after PFT.   Karren Burly, MD 11/06/2023   I spent 41 minutes in the care of the patient including patient this visit, review of records, coordination of care.

## 2023-11-08 ENCOUNTER — Encounter: Admitting: Speech Pathology

## 2024-02-11 ENCOUNTER — Other Ambulatory Visit (HOSPITAL_BASED_OUTPATIENT_CLINIC_OR_DEPARTMENT_OTHER): Payer: Self-pay | Admitting: Family

## 2024-03-19 DIAGNOSIS — G4733 Obstructive sleep apnea (adult) (pediatric): Secondary | ICD-10-CM | POA: Diagnosis not present

## 2024-03-19 DIAGNOSIS — E7800 Pure hypercholesterolemia, unspecified: Secondary | ICD-10-CM | POA: Diagnosis not present

## 2024-03-19 DIAGNOSIS — M109 Gout, unspecified: Secondary | ICD-10-CM | POA: Diagnosis not present

## 2024-03-19 DIAGNOSIS — I10 Essential (primary) hypertension: Secondary | ICD-10-CM | POA: Diagnosis not present

## 2024-03-19 DIAGNOSIS — E291 Testicular hypofunction: Secondary | ICD-10-CM | POA: Diagnosis not present

## 2024-03-19 DIAGNOSIS — N529 Male erectile dysfunction, unspecified: Secondary | ICD-10-CM | POA: Diagnosis not present

## 2024-03-19 DIAGNOSIS — J383 Other diseases of vocal cords: Secondary | ICD-10-CM | POA: Diagnosis not present

## 2024-03-19 DIAGNOSIS — R0602 Shortness of breath: Secondary | ICD-10-CM | POA: Diagnosis not present

## 2024-03-19 DIAGNOSIS — K219 Gastro-esophageal reflux disease without esophagitis: Secondary | ICD-10-CM | POA: Diagnosis not present

## 2024-03-19 DIAGNOSIS — N1831 Chronic kidney disease, stage 3a: Secondary | ICD-10-CM | POA: Diagnosis not present

## 2024-03-19 DIAGNOSIS — N400 Enlarged prostate without lower urinary tract symptoms: Secondary | ICD-10-CM | POA: Diagnosis not present

## 2024-03-19 DIAGNOSIS — R7303 Prediabetes: Secondary | ICD-10-CM | POA: Diagnosis not present

## 2024-04-15 DIAGNOSIS — H2513 Age-related nuclear cataract, bilateral: Secondary | ICD-10-CM | POA: Diagnosis not present

## 2024-04-15 DIAGNOSIS — H04123 Dry eye syndrome of bilateral lacrimal glands: Secondary | ICD-10-CM | POA: Diagnosis not present

## 2024-04-15 DIAGNOSIS — H35373 Puckering of macula, bilateral: Secondary | ICD-10-CM | POA: Diagnosis not present

## 2024-04-15 DIAGNOSIS — H524 Presbyopia: Secondary | ICD-10-CM | POA: Diagnosis not present

## 2024-06-09 DIAGNOSIS — Z125 Encounter for screening for malignant neoplasm of prostate: Secondary | ICD-10-CM | POA: Diagnosis not present

## 2024-06-16 DIAGNOSIS — N401 Enlarged prostate with lower urinary tract symptoms: Secondary | ICD-10-CM | POA: Diagnosis not present

## 2024-06-16 DIAGNOSIS — R350 Frequency of micturition: Secondary | ICD-10-CM | POA: Diagnosis not present

## 2024-08-27 ENCOUNTER — Encounter: Payer: Self-pay | Admitting: Physical Medicine and Rehabilitation

## 2024-08-27 ENCOUNTER — Ambulatory Visit: Admitting: Physical Medicine and Rehabilitation

## 2024-08-27 DIAGNOSIS — M7918 Myalgia, other site: Secondary | ICD-10-CM

## 2024-08-27 DIAGNOSIS — M5416 Radiculopathy, lumbar region: Secondary | ICD-10-CM | POA: Diagnosis not present

## 2024-08-27 DIAGNOSIS — M4310 Spondylolisthesis, site unspecified: Secondary | ICD-10-CM | POA: Diagnosis not present

## 2024-08-27 NOTE — Progress Notes (Signed)
 "  Mitchell Todd - 69 y.o. male MRN [id number]  Date of birth: [birth year]  Office Visit Note: Visit Date: 08/27/2024 PCP: Wonda Worth SQUIBB, PA Referred by: Turmel, Caleb P, PA  Subjective: Chief Complaint  Patient presents with   Lower Back - Pain   HPI: Mitchell Todd is a 69 y.o. male who comes in today for evaluation of chronic, worsening and severe right sided buttock pain radiating down right leg. Pain ongoing for several years. He was last seen in our office December of 2023. His pain worsens with activity and walking. He describes pain as sharp and stabbing sensation, currently rates as 7 out of 10. Some relief of pain with home exercise regimen, rest and use of medications. History of formal physical therapy with minimal relief of pain. Lumbar MRI imaging from 2023 shows 8 mm anterolisthesis L5-S1 with bilateral pars defects of L5. Moderate to severe right subarticular stenosis with right L5 nerve root impingement. He underwent right L5 transforaminal epidural steroid injection in our office on 07/09/2022. He reports greater than 80% relief of pain for over a year. Patient currently working in holiday representative, he is veterinary surgeon. Patient denies focal weakness, numbness and tingling. No recent trauma or falls.       Review of Systems  Musculoskeletal:  Positive for back pain.  Neurological:  Negative for tingling, sensory change, focal weakness and weakness.  All other systems reviewed and are negative.  Otherwise per HPI.  Assessment & Plan: Visit Diagnoses:    ICD-10-CM   1. Right buttock pain  M79.18 Ambulatory referral to Physical Medicine Rehab    2. Lumbar radiculopathy  M54.16 Ambulatory referral to Physical Medicine Rehab    3. Pars defect with spondylolisthesis  M43.10 Ambulatory referral to Physical Medicine Rehab       Plan: Findings:  Chronic, worsening and severe right sided buttock pain radiating down right leg. Patient continues to have severe pain despite good  conservative therapies such as formal physical therapy, home exercise regimen, rest and use of medications. Patients clinical presentation and exam are consistent with lumbar radiculopathy, more of L5 nerve distribution. There is anterolisthesis at L5-S1 with bilateral pars defects, moderate to severe right subarticular stenosis at this level. Significant relief of pain with previous lumbar epidural steroid injection. Next step is to perform diagnostic and hopefully therapeutic right L5 transforaminal epidural steroid injection under fluoroscopic guidance. If good relief of pain with injection we can repeat this procedure infrequently as needed. He has no questions at this time. He is planning to go out of town on 09/09/2024, we will do our best to work him in prior to his trip. His exam today is non focal, good strength noted to bilateral lower extremities.     Meds & Orders: No orders of the defined types were placed in this encounter.   Orders Placed This Encounter  Procedures   Ambulatory referral to Physical Medicine Rehab    Follow-up: Return for Right L5 transforaminal epidural steroid injection.   Procedures: No procedures performed      Clinical History: EXAM: MRI LUMBAR SPINE WITHOUT CONTRAST   TECHNIQUE: Multiplanar, multisequence MR imaging of the lumbar spine was performed. No intravenous contrast was administered.   COMPARISON:  Lumbar radiographs 04/10/2022   FINDINGS: Segmentation:  5 lumbar vertebra.  Lowest disc space L5-S1   Alignment:  8 mm anterolisthesis L5-S1.  Remaining alignment normal   Vertebrae: Bilateral pars defects of L5. Negative for fracture or mass. Hemangioma L4  vertebral body on the left.   Conus medullaris and cauda equina: Conus extends to the L1-2 level. Conus and cauda equina appear normal.   Paraspinal and other soft tissues: Negative for paraspinous mass or adenopathy   Disc levels:   L1-2: Mild disc degeneration and disc bulging.  Borderline spinal stenosis.   L2-3: Disc degeneration with diffuse disc bulging. Bilateral facet degeneration. Mild to moderate spinal stenosis. Mild to moderate subarticular stenosis bilaterally.   L3-4: Disc degeneration with diffuse disc bulging and broad-based central disc protrusion. Bilateral facet degeneration. Mild to moderate central canal stenosis. Mild to moderate subarticular stenosis bilaterally   L4-5: Disc degeneration with disc bulging. Shallow left foraminal disc protrusion. Bilateral facet hypertrophy, moderate in degree. Mild central canal stenosis. Mild right subarticular stenosis and mild to moderate left subarticular stenosis   L5-S1: 8 mm anterolisthesis with bilateral pars defects of L5. Bilateral facet degeneration. Moderate to severe right subarticular stenosis with right L5 nerve root impingement. Moderate left foraminal encroachment.   IMPRESSION: 1. Mild to moderate spinal stenosis L2-3 with mild to moderate subarticular stenosis bilaterally. 2. Mild to moderate central canal stenosis L3-4 with mild to moderate subarticular stenosis bilaterally. 3. Shallow left foraminal disc protrusion L4-5 with mild central canal stenosis. Mild right subarticular stenosis and mild to moderate left subarticular stenosis. 4. 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 Moderate to severe right subarticular stenosis with right L5 nerve root impingement. Moderate left foraminal encroachment.     Electronically Signed   By: Carlin Gaskins M.D.   On: 06/19/2022 14:25   He reports that he has quit smoking. He has never used smokeless tobacco. No results for input(s): HGBA1C, LABURIC in the last 8760 hours.  Objective:  VS:  HT:    WT:   BMI:     BP:   HR: bpm  TEMP: ( )  RESP:  Physical Exam Vitals and nursing note reviewed.  HENT:     Head: Normocephalic and atraumatic.     Right Ear: External ear normal.     Left Ear: External ear  normal.     Nose: Nose normal.     Mouth/Throat:     Mouth: Mucous membranes are moist.  Eyes:     Extraocular Movements: Extraocular movements intact.  Cardiovascular:     Rate and Rhythm: Normal rate.     Pulses: Normal pulses.  Pulmonary:     Effort: Pulmonary effort is normal.  Abdominal:     General: Abdomen is flat. There is no distension.  Musculoskeletal:        General: Tenderness present.     Cervical back: Normal range of motion.     Comments: Patient rises from seated position to standing without difficulty. Good lumbar range of motion. No pain noted with facet loading. 5/5 strength noted with bilateral hip flexion, knee flexion/extension, ankle dorsiflexion/plantarflexion and EHL. No clonus noted bilaterally. No pain upon palpation of greater trochanters. No pain with internal/external rotation of bilateral hips. Sensation intact bilaterally. Dysesthesias noted to right L5 dermatome. Positive straight leg raise on the right. Negative slump test bilaterally. Ambulates without aid, gait steady.     Skin:    General: Skin is warm and dry.     Capillary Refill: Capillary refill takes less than 2 seconds.  Neurological:     General: No focal deficit present.     Mental Status: He is alert and oriented to person, place, and time.  Psychiatric:  Mood and Affect: Mood normal.        Behavior: Behavior normal.     Ortho Exam  Imaging: No results found.  Past Medical/Family/Surgical/Social History: Medications & Allergies reviewed per EMR, new medications updated. Patient Active Problem List   Diagnosis Date Noted   Dyspnea on exertion 02/22/2023   OSA (obstructive sleep apnea)    BPH (benign prostatic hyperplasia) 11/21/2020   CAD 11/14/2007   ESOPHAGEAL STRICTURE 11/14/2007   GERD 11/14/2007   Past Medical History:  Diagnosis Date   Arthritis    knees oa   Concussion 04/2018   no residual from   DOE (dyspnea on exertion)    Eczema    Elevated  cholesterol    GERD (gastroesophageal reflux disease)    Gout    Hypertension    Sleep apnea    ues cpap set on 7  uses 2 oe 3 night s per week   Urinary frequency    Wears glasses    History reviewed. No pertinent family history. Past Surgical History:  Procedure Laterality Date   APPENDECTOMY  as child   foot cyst removed Right 2012   TONSILLECTOMY  as child   TRANSURETHRAL RESECTION OF PROSTATE N/A 11/21/2020   Procedure: TRANSURETHRAL RESECTION OF THE PROSTATE (TURP)/ BIPOLAR;  Surgeon: Selma Donnice SAUNDERS, MD;  Location: [facility];  Service: Urology;  Laterality: N/A;   trigger finger surgeyr  left thumb 2012   Social History   Occupational History   Not on file  Tobacco Use   Smoking status: Former   Smokeless tobacco: Never  Substance and Sexual Activity   Alcohol use: Not on file   Drug use: Not on file   Sexual activity: Not on file   "

## 2024-08-27 NOTE — Progress Notes (Signed)
 Pain Scale   Average Pain 1 Patient advising he has lower back pain that radiates to right side.        +Driver, -BT, -Dye Allergies.

## 2024-08-31 ENCOUNTER — Encounter (HOSPITAL_BASED_OUTPATIENT_CLINIC_OR_DEPARTMENT_OTHER): Payer: Self-pay

## 2024-09-03 ENCOUNTER — Ambulatory Visit: Admitting: Physical Medicine and Rehabilitation

## 2024-09-03 ENCOUNTER — Other Ambulatory Visit: Payer: Self-pay

## 2024-09-03 VITALS — BP 127/81 | HR 83

## 2024-09-03 DIAGNOSIS — M5416 Radiculopathy, lumbar region: Secondary | ICD-10-CM

## 2024-09-03 MED ORDER — METHYLPREDNISOLONE ACETATE 40 MG/ML IJ SUSP
40.0000 mg | Freq: Once | INTRAMUSCULAR | Status: AC
  Administered 2024-09-03: 40 mg

## 2024-09-03 NOTE — Procedures (Signed)
 Lumbosacral Transforaminal Epidural Steroid Injection - Sub-Pedicular Approach with Fluoroscopic Guidance  Patient: Mitchell Todd      Date of Birth: 08/22/1955 MRN: [id number] PCP: Wonda Worth SQUIBB, PA      Visit Date: 09/03/2024   Universal Protocol:    Date/Time: 09/03/2024  Consent Given By: the patient  Position: PRONE  Additional Comments: Vital signs were monitored before and after the procedure. Patient was prepped and draped in the usual sterile fashion. The correct patient, procedure, and site was verified.   Injection Procedure Details:   Procedure diagnoses: Lumbar radiculopathy [M54.16]    Meds Administered:  Meds ordered this encounter  Medications   methylPREDNISolone  acetate (DEPO-MEDROL ) injection 40 mg    Laterality: Right  Location/Site: L5  Needle:5.0 in., 22 ga.  Short bevel or Quincke spinal needle  Needle Placement: Transforaminal  Findings:    -Comments: Excellent flow of contrast along the nerve, nerve root and into the epidural space.  Procedure Details: After squaring off the end-plates to get a true AP view, the C-arm was positioned so that an oblique view of the foramen as noted above was visualized. The target area is just inferior to the nose of the scotty dog or sub pedicular. The soft tissues overlying this structure were infiltrated with 2-3 ml. of 1% Lidocaine  without Epinephrine.  The spinal needle was inserted toward the target using a trajectory view along the fluoroscope beam.  Under AP and lateral visualization, the needle was advanced so it did not puncture dura and was located close the 6 O'Clock position of the pedical in AP tracterory. Biplanar projections were used to confirm position. Aspiration was confirmed to be negative for CSF and/or blood. A 1-2 ml. volume of Isovue-250 was injected and flow of contrast was noted at each level. Radiographs were obtained for documentation purposes.   After attaining the desired flow  of contrast documented above, a 0.5 to 1.0 ml test dose of 0.25% Marcaine was injected into each respective transforaminal space.  The patient was observed for 90 seconds post injection.  After no sensory deficits were reported, and normal lower extremity motor function was noted,   the above injectate was administered so that equal amounts of the injectate were placed at each foramen (level) into the transforaminal epidural space.   Additional Comments:  The patient tolerated the procedure well Dressing: 2 x 2 sterile gauze and Band-Aid    Post-procedure details: Patient was observed during the procedure. Post-procedure instructions were reviewed.  Patient left the clinic in stable condition.

## 2024-09-03 NOTE — Progress Notes (Signed)
 "  Mitchell Todd - 69 y.o. male MRN [id number]  Date of birth: August 31, 1955  Office Visit Note: Visit Date: 09/03/2024 PCP: Wonda Worth SQUIBB, PA Referred by: Turmel, Worth SQUIBB, PA  Subjective: Chief Complaint  Patient presents with   Lower Back - Pain   HPI:  Mitchell Todd is a 69 y.o. male who comes in today at the request of Duwaine Pouch, FNP for planned Right L5-S1 Lumbar Transforaminal epidural steroid injection with fluoroscopic guidance.  The patient has failed conservative care including home exercise, medications, time and activity modification.  This injection will be diagnostic and hopefully therapeutic.  Please see requesting physician notes for further details and justification.   ROS Otherwise per HPI.  Assessment & Plan: Visit Diagnoses:    ICD-10-CM   1. Lumbar radiculopathy  M54.16 XR C-ARM NO REPORT    Epidural Steroid injection    methylPREDNISolone  acetate (DEPO-MEDROL ) injection 40 mg      Plan: No additional findings.   Meds & Orders:  Meds ordered this encounter  Medications   methylPREDNISolone  acetate (DEPO-MEDROL ) injection 40 mg    Orders Placed This Encounter  Procedures   XR C-ARM NO REPORT   Epidural Steroid injection    Follow-up: Return for visit to requesting provider as needed.   Procedures: No procedures performed  Lumbosacral Transforaminal Epidural Steroid Injection - Sub-Pedicular Approach with Fluoroscopic Guidance  Patient: Mitchell Todd      Date of Birth: January 08, 1956 MRN: [id number] PCP: Wonda Worth SQUIBB, PA      Visit Date: 09/03/2024   Universal Protocol:    Date/Time: 09/03/2024  Consent Given By: the patient  Position: PRONE  Additional Comments: Vital signs were monitored before and after the procedure. Patient was prepped and draped in the usual sterile fashion. The correct patient, procedure, and site was verified.   Injection Procedure Details:   Procedure diagnoses: Lumbar radiculopathy [M54.16]    Meds  Administered:  Meds ordered this encounter  Medications   methylPREDNISolone  acetate (DEPO-MEDROL ) injection 40 mg    Laterality: Right  Location/Site: L5  Needle:5.0 in., 22 ga.  Short bevel or Quincke spinal needle  Needle Placement: Transforaminal  Findings:    -Comments: Excellent flow of contrast along the nerve, nerve root and into the epidural space.  Procedure Details: After squaring off the end-plates to get a true AP view, the C-arm was positioned so that an oblique view of the foramen as noted above was visualized. The target area is just inferior to the nose of the scotty dog or sub pedicular. The soft tissues overlying this structure were infiltrated with 2-3 ml. of 1% Lidocaine  without Epinephrine.  The spinal needle was inserted toward the target using a trajectory view along the fluoroscope beam.  Under AP and lateral visualization, the needle was advanced so it did not puncture dura and was located close the 6 O'Clock position of the pedical in AP tracterory. Biplanar projections were used to confirm position. Aspiration was confirmed to be negative for CSF and/or blood. A 1-2 ml. volume of Isovue-250 was injected and flow of contrast was noted at each level. Radiographs were obtained for documentation purposes.   After attaining the desired flow of contrast documented above, a 0.5 to 1.0 ml test dose of 0.25% Marcaine was injected into each respective transforaminal space.  The patient was observed for 90 seconds post injection.  After no sensory deficits were reported, and normal lower extremity motor function was noted,   the above  injectate was administered so that equal amounts of the injectate were placed at each foramen (level) into the transforaminal epidural space.   Additional Comments:  The patient tolerated the procedure well Dressing: 2 x 2 sterile gauze and Band-Aid    Post-procedure details: Patient was observed during the procedure. Post-procedure  instructions were reviewed.  Patient left the clinic in stable condition.    Clinical History: EXAM: MRI LUMBAR SPINE WITHOUT CONTRAST   TECHNIQUE: Multiplanar, multisequence MR imaging of the lumbar spine was performed. No intravenous contrast was administered.   COMPARISON:  Lumbar radiographs 04/10/2022   FINDINGS: Segmentation:  5 lumbar vertebra.  Lowest disc space L5-S1   Alignment:  8 mm anterolisthesis L5-S1.  Remaining alignment normal   Vertebrae: Bilateral pars defects of L5. Negative for fracture or mass. Hemangioma L4 vertebral body on the left.   Conus medullaris and cauda equina: Conus extends to the L1-2 level. Conus and cauda equina appear normal.   Paraspinal and other soft tissues: Negative for paraspinous mass or adenopathy   Disc levels:   L1-2: Mild disc degeneration and disc bulging. Borderline spinal stenosis.   L2-3: Disc degeneration with diffuse disc bulging. Bilateral facet degeneration. Mild to moderate spinal stenosis. Mild to moderate subarticular stenosis bilaterally.   L3-4: Disc degeneration with diffuse disc bulging and broad-based central disc protrusion. Bilateral facet degeneration. Mild to moderate central canal stenosis. Mild to moderate subarticular stenosis bilaterally   L4-5: Disc degeneration with disc bulging. Shallow left foraminal disc protrusion. Bilateral facet hypertrophy, moderate in degree. Mild central canal stenosis. Mild right subarticular stenosis and mild to moderate left subarticular stenosis   L5-S1: 8 mm anterolisthesis with bilateral pars defects of L5. Bilateral facet degeneration. Moderate to severe right subarticular stenosis with right L5 nerve root impingement. Moderate left foraminal encroachment.   IMPRESSION: 1. Mild to moderate spinal stenosis L2-3 with mild to moderate subarticular stenosis bilaterally. 2. Mild to moderate central canal stenosis L3-4 with mild to moderate subarticular  stenosis bilaterally. 3. Shallow left foraminal disc protrusion L4-5 with mild central canal stenosis. Mild right subarticular stenosis and mild to moderate left subarticular stenosis. 4. 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 Moderate to severe right subarticular stenosis with right L5 nerve root impingement. Moderate left foraminal encroachment.     Electronically Signed   By: Carlin Gaskins M.D.   On: 06/19/2022 14:25     Objective:  VS:  HT:    WT:   BMI:     BP:127/81  HR:83bpm  TEMP: ( )  RESP:  Physical Exam Vitals and nursing note reviewed.  Constitutional:      General: He is not in acute distress.    Appearance: Normal appearance. He is not ill-appearing.  HENT:     Head: Normocephalic and atraumatic.     Right Ear: External ear normal.     Left Ear: External ear normal.     Nose: No congestion.  Eyes:     Extraocular Movements: Extraocular movements intact.  Cardiovascular:     Rate and Rhythm: Normal rate.     Pulses: Normal pulses.  Pulmonary:     Effort: Pulmonary effort is normal. No respiratory distress.  Abdominal:     General: There is no distension.     Palpations: Abdomen is soft.  Musculoskeletal:        General: No tenderness or signs of injury.     Cervical back: Neck supple.     Right lower leg:  No edema.     Left lower leg: No edema.     Comments: Patient has good distal strength without clonus.  Skin:    Findings: No erythema or rash.  Neurological:     General: No focal deficit present.     Mental Status: He is alert and oriented to person, place, and time.     Sensory: No sensory deficit.     Motor: No weakness or abnormal muscle tone.     Coordination: Coordination normal.  Psychiatric:        Mood and Affect: Mood normal.        Behavior: Behavior normal.      Imaging: No results found. "

## 2024-09-03 NOTE — Progress Notes (Signed)
 Pain Scale   Average Pain 2 Patient advising he has lower back pain radiating to right leg pain increases at night and at times when walking        +Driver, -BT, -Dye Allergies.
